# Patient Record
Sex: Female | Born: 1940
Health system: Southern US, Community
[De-identification: ages and names within clinical notes are randomized; demographics above are authoritative.]

## PROBLEM LIST (undated history)

## (undated) DIAGNOSIS — R7303 Prediabetes: Secondary | ICD-10-CM

## (undated) DIAGNOSIS — I422 Other hypertrophic cardiomyopathy: Secondary | ICD-10-CM

## (undated) DIAGNOSIS — I480 Paroxysmal atrial fibrillation: Secondary | ICD-10-CM

## (undated) DIAGNOSIS — D693 Immune thrombocytopenic purpura: Secondary | ICD-10-CM

## (undated) HISTORY — DX: Immune thrombocytopenic purpura: D69.3

## (undated) HISTORY — DX: Prediabetes: R73.03

## (undated) HISTORY — DX: Other hypertrophic cardiomyopathy: I42.2

## (undated) HISTORY — DX: Paroxysmal atrial fibrillation: I48.0

---

## 2016-03-12 DIAGNOSIS — Z23 Encounter for immunization: Secondary | ICD-10-CM | POA: Diagnosis not present

## 2016-06-12 DIAGNOSIS — S4992XA Unspecified injury of left shoulder and upper arm, initial encounter: Secondary | ICD-10-CM | POA: Diagnosis not present

## 2016-06-12 DIAGNOSIS — M25512 Pain in left shoulder: Secondary | ICD-10-CM | POA: Diagnosis not present

## 2016-06-12 DIAGNOSIS — R030 Elevated blood-pressure reading, without diagnosis of hypertension: Secondary | ICD-10-CM | POA: Diagnosis not present

## 2016-12-27 ENCOUNTER — Encounter (HOSPITAL_COMMUNITY): Payer: Self-pay | Admitting: Emergency Medicine

## 2016-12-27 ENCOUNTER — Ambulatory Visit (HOSPITAL_COMMUNITY)
Admission: EM | Admit: 2016-12-27 | Discharge: 2016-12-27 | Disposition: A | Discharge status: Home / Self Care | Payer: Medicare Other

## 2016-12-27 DIAGNOSIS — R062 Wheezing: Secondary | ICD-10-CM | POA: Diagnosis not present

## 2016-12-27 DIAGNOSIS — J069 Acute upper respiratory infection, unspecified: Secondary | ICD-10-CM | POA: Diagnosis not present

## 2016-12-27 MED ORDER — ALBUTEROL SULFATE HFA 108 (90 BASE) MCG/ACT IN AERS: 1.0000 | INHALATION_SPRAY | Freq: Four times a day (QID) | RESPIRATORY_TRACT | 0 refills | Status: DC | PRN

## 2016-12-27 MED ORDER — PREDNISONE 10 MG (21) PO TBPK: ORAL_TABLET | Freq: Every day | ORAL | 0 refills | Status: DC

## 2016-12-27 NOTE — ED Provider Notes (Signed)
CSN: [id number]     Arrival date & time 12/27/16  1530 History   None    Chief Complaint  Patient presents with  . Shortness of Breath   (Consider location/radiation/quality/duration/timing/severity/associated sxs/prior Treatment) 76 yo female comes in with daughter in law for worsening shortness of breath on exertion after being diagnosed with URI 5 days ago. She is on day 3-4 of Azithromax. Patient is visiting from [place] and got off the plane today. She states that she has had some coughing, but is feeling much since antibiotic use. However, she is experiencing wheezing and shortness on breath on exertion. Daughter in law states that they were shopping and patient had to rest due to shortness of breath after awhile, which is unusual for her. Patient without history of smoking, asthma, lung disease. Denies fever, ear/eye pain, abdominal pain. Denies sore throat, rhinorrhea. Denies sick contact.       History reviewed. No pertinent past medical history. History reviewed. No pertinent surgical history. History reviewed. No pertinent family history. Social History  Substance Use Topics  . Smoking status: Never Smoker  . Smokeless tobacco: Never Used  . Alcohol use No   OB History    No data available     Review of Systems  Constitutional: Negative for chills, diaphoresis and fever.  HENT: Negative for congestion, ear discharge, ear pain, postnasal drip, rhinorrhea, sinus pain, sinus pressure, sneezing and sore throat.   Eyes: Negative for pain, discharge, redness and itching.  Respiratory: Positive for cough, shortness of breath and wheezing.   Cardiovascular: Negative for chest pain and palpitations.  Gastrointestinal: Negative for abdominal pain.    Allergies  Patient has no known allergies.  Home Medications   Prior to Admission medications   Medication Sig Start Date End Date Taking? Authorizing Provider  azithromycin (ZITHROMAX) 250 MG tablet Take by mouth daily.   Yes  [provider]  albuterol (PROVENTIL HFA;VENTOLIN HFA) 108 (90 Base) MCG/ACT inhaler Inhale 1-2 puffs into the lungs every 6 (six) hours as needed for wheezing or shortness of breath. 12/27/16   Cathie Hoops, Amy V, PA-C  predniSONE (STERAPRED UNI-PAK 21 TAB) 10 MG (21) TBPK tablet Take by mouth daily. Take 6 tabs by mouth day 1, then 5 tabs, then 4 tabs, then 3 tabs, 2 tabs, then 1 tab for the last day 12/27/16   Belinda Fisher, PA-C   Meds Ordered and Administered this Visit  Medications - No data to display  BP 125/75 (BP Location: Right Arm)   Pulse 89   Temp 98.5 F (36.9 C) (Oral)   Resp 20   SpO2 96%  No data found.   Physical Exam  Constitutional: She is oriented to person, place, and time. She appears well-developed and well-nourished. No distress.  HENT:  Head: Normocephalic and atraumatic.  Right Ear: Tympanic membrane, external ear and ear canal normal.  Left Ear: Tympanic membrane, external ear and ear canal normal.  Nose: Nose normal.  Mouth/Throat: Uvula is midline, oropharynx is clear and moist and mucous membranes are normal.  Eyes: Conjunctivae are normal. Pupils are equal, round, and reactive to light.  Cardiovascular: Normal rate and regular rhythm.  Exam reveals no gallop and no friction rub.   Murmur heard. Pulmonary/Chest:  Patient with wheezing throughout. No rhonchi, rales noted. Not in respiratory distress.   Lymphadenopathy:    She has no cervical adenopathy.  Neurological: She is alert and oriented to person, place, and time.  Skin: Skin is warm and dry.  Psychiatric: She has a normal mood and affect. Her behavior is normal. Judgment normal.    Urgent Care Course     Procedures (including critical care time)  Labs Review Labs Reviewed - No data to display  Imaging Review No results found.      MDM   1. Viral URI   2. Wheezing    1. Discussed with patient and family member patient history and exam most consistent with reactive airway due to  URI. Patient to finish Azithromax. Start Prednisone dose pak as directed and Albuterol inhaler PRN. Instructions on inhaler provided for patient. Patient to monitor for further trouble breathing, fever, worsening of symptoms.    Belinda FisherYu, Amy V, PA-C 12/27/16 [id number]

## 2016-12-27 NOTE — ED Triage Notes (Signed)
[facility] with a complaint of SOB. The patient stated that she has been on a Z-Pack for a URI x 1 week. She reported increased SOB and a cough with exersion more recently.

## 2017-03-10 DIAGNOSIS — Z23 Encounter for immunization: Secondary | ICD-10-CM | POA: Diagnosis not present

## 2018-04-06 DIAGNOSIS — Z23 Encounter for immunization: Secondary | ICD-10-CM | POA: Diagnosis not present

## 2018-06-26 DIAGNOSIS — J208 Acute bronchitis due to other specified organisms: Secondary | ICD-10-CM | POA: Diagnosis not present

## 2018-06-26 DIAGNOSIS — J9801 Acute bronchospasm: Secondary | ICD-10-CM | POA: Diagnosis not present

## 2018-06-26 DIAGNOSIS — J101 Influenza due to other identified influenza virus with other respiratory manifestations: Secondary | ICD-10-CM | POA: Diagnosis not present

## 2018-06-26 DIAGNOSIS — E039 Hypothyroidism, unspecified: Secondary | ICD-10-CM | POA: Diagnosis not present

## 2018-06-26 DIAGNOSIS — M545 Low back pain: Secondary | ICD-10-CM | POA: Diagnosis not present

## 2018-06-26 DIAGNOSIS — G8929 Other chronic pain: Secondary | ICD-10-CM | POA: Diagnosis not present

## 2018-06-26 DIAGNOSIS — J9601 Acute respiratory failure with hypoxia: Secondary | ICD-10-CM | POA: Diagnosis not present

## 2018-06-26 DIAGNOSIS — R0602 Shortness of breath: Secondary | ICD-10-CM | POA: Diagnosis not present

## 2018-06-26 DIAGNOSIS — K2970 Gastritis, unspecified, without bleeding: Secondary | ICD-10-CM | POA: Diagnosis not present

## 2018-06-26 DIAGNOSIS — M25569 Pain in unspecified knee: Secondary | ICD-10-CM | POA: Diagnosis not present

## 2018-06-26 DIAGNOSIS — D509 Iron deficiency anemia, unspecified: Secondary | ICD-10-CM | POA: Diagnosis not present

## 2018-06-26 DIAGNOSIS — K648 Other hemorrhoids: Secondary | ICD-10-CM | POA: Diagnosis not present

## 2018-06-26 DIAGNOSIS — J111 Influenza due to unidentified influenza virus with other respiratory manifestations: Secondary | ICD-10-CM | POA: Diagnosis not present

## 2018-06-26 DIAGNOSIS — D62 Acute posthemorrhagic anemia: Secondary | ICD-10-CM | POA: Diagnosis not present

## 2018-06-26 DIAGNOSIS — Z789 Other specified health status: Secondary | ICD-10-CM | POA: Diagnosis not present

## 2018-06-26 DIAGNOSIS — I340 Nonrheumatic mitral (valve) insufficiency: Secondary | ICD-10-CM | POA: Diagnosis not present

## 2018-06-27 DIAGNOSIS — J9601 Acute respiratory failure with hypoxia: Secondary | ICD-10-CM | POA: Diagnosis not present

## 2018-06-27 DIAGNOSIS — D62 Acute posthemorrhagic anemia: Secondary | ICD-10-CM | POA: Diagnosis not present

## 2018-06-27 DIAGNOSIS — E039 Hypothyroidism, unspecified: Secondary | ICD-10-CM | POA: Diagnosis not present

## 2018-06-27 DIAGNOSIS — D509 Iron deficiency anemia, unspecified: Secondary | ICD-10-CM | POA: Diagnosis not present

## 2018-06-27 DIAGNOSIS — J111 Influenza due to unidentified influenza virus with other respiratory manifestations: Secondary | ICD-10-CM | POA: Diagnosis not present

## 2018-06-27 DIAGNOSIS — J101 Influenza due to other identified influenza virus with other respiratory manifestations: Secondary | ICD-10-CM | POA: Diagnosis not present

## 2018-06-28 DIAGNOSIS — R0602 Shortness of breath: Secondary | ICD-10-CM | POA: Diagnosis not present

## 2018-06-28 DIAGNOSIS — Z79899 Other long term (current) drug therapy: Secondary | ICD-10-CM | POA: Diagnosis not present

## 2018-06-28 DIAGNOSIS — Z87891 Personal history of nicotine dependence: Secondary | ICD-10-CM | POA: Diagnosis not present

## 2018-06-28 DIAGNOSIS — D509 Iron deficiency anemia, unspecified: Secondary | ICD-10-CM | POA: Diagnosis not present

## 2018-06-28 DIAGNOSIS — Z789 Other specified health status: Secondary | ICD-10-CM | POA: Diagnosis not present

## 2018-06-28 DIAGNOSIS — K649 Unspecified hemorrhoids: Secondary | ICD-10-CM | POA: Diagnosis not present

## 2018-06-28 DIAGNOSIS — K3189 Other diseases of stomach and duodenum: Secondary | ICD-10-CM | POA: Diagnosis not present

## 2018-06-28 DIAGNOSIS — D62 Acute posthemorrhagic anemia: Secondary | ICD-10-CM | POA: Diagnosis not present

## 2018-06-28 DIAGNOSIS — D558 Other anemias due to enzyme disorders: Secondary | ICD-10-CM | POA: Diagnosis not present

## 2018-06-28 DIAGNOSIS — J9601 Acute respiratory failure with hypoxia: Secondary | ICD-10-CM | POA: Diagnosis not present

## 2018-06-28 DIAGNOSIS — M25569 Pain in unspecified knee: Secondary | ICD-10-CM | POA: Diagnosis not present

## 2018-06-28 DIAGNOSIS — G8929 Other chronic pain: Secondary | ICD-10-CM | POA: Diagnosis not present

## 2018-06-28 DIAGNOSIS — M545 Low back pain: Secondary | ICD-10-CM | POA: Diagnosis not present

## 2018-06-28 DIAGNOSIS — K648 Other hemorrhoids: Secondary | ICD-10-CM | POA: Diagnosis not present

## 2018-06-28 DIAGNOSIS — I340 Nonrheumatic mitral (valve) insufficiency: Secondary | ICD-10-CM | POA: Diagnosis not present

## 2018-06-28 DIAGNOSIS — E039 Hypothyroidism, unspecified: Secondary | ICD-10-CM | POA: Diagnosis not present

## 2018-06-28 DIAGNOSIS — J101 Influenza due to other identified influenza virus with other respiratory manifestations: Secondary | ICD-10-CM | POA: Diagnosis not present

## 2018-06-28 DIAGNOSIS — J208 Acute bronchitis due to other specified organisms: Secondary | ICD-10-CM | POA: Diagnosis not present

## 2018-06-28 DIAGNOSIS — K2970 Gastritis, unspecified, without bleeding: Secondary | ICD-10-CM | POA: Diagnosis not present

## 2018-06-29 DIAGNOSIS — K2970 Gastritis, unspecified, without bleeding: Secondary | ICD-10-CM | POA: Diagnosis not present

## 2018-06-29 DIAGNOSIS — J101 Influenza due to other identified influenza virus with other respiratory manifestations: Secondary | ICD-10-CM | POA: Diagnosis not present

## 2018-06-29 DIAGNOSIS — M25569 Pain in unspecified knee: Secondary | ICD-10-CM | POA: Diagnosis not present

## 2018-06-29 DIAGNOSIS — J208 Acute bronchitis due to other specified organisms: Secondary | ICD-10-CM | POA: Diagnosis not present

## 2018-06-29 DIAGNOSIS — J9601 Acute respiratory failure with hypoxia: Secondary | ICD-10-CM | POA: Diagnosis not present

## 2018-06-29 DIAGNOSIS — M545 Low back pain: Secondary | ICD-10-CM | POA: Diagnosis not present

## 2018-06-29 DIAGNOSIS — Z789 Other specified health status: Secondary | ICD-10-CM | POA: Diagnosis not present

## 2018-06-29 DIAGNOSIS — I340 Nonrheumatic mitral (valve) insufficiency: Secondary | ICD-10-CM | POA: Diagnosis not present

## 2018-06-29 DIAGNOSIS — K648 Other hemorrhoids: Secondary | ICD-10-CM | POA: Diagnosis not present

## 2018-06-29 DIAGNOSIS — D62 Acute posthemorrhagic anemia: Secondary | ICD-10-CM | POA: Diagnosis not present

## 2018-06-29 DIAGNOSIS — E039 Hypothyroidism, unspecified: Secondary | ICD-10-CM | POA: Diagnosis not present

## 2018-06-29 DIAGNOSIS — G8929 Other chronic pain: Secondary | ICD-10-CM | POA: Diagnosis not present

## 2018-06-30 DIAGNOSIS — D509 Iron deficiency anemia, unspecified: Secondary | ICD-10-CM | POA: Diagnosis not present

## 2018-07-01 DIAGNOSIS — I340 Nonrheumatic mitral (valve) insufficiency: Secondary | ICD-10-CM | POA: Diagnosis not present

## 2018-07-01 DIAGNOSIS — R0602 Shortness of breath: Secondary | ICD-10-CM | POA: Diagnosis not present

## 2018-07-01 DIAGNOSIS — Z79899 Other long term (current) drug therapy: Secondary | ICD-10-CM | POA: Diagnosis not present

## 2018-07-01 DIAGNOSIS — I248 Other forms of acute ischemic heart disease: Secondary | ICD-10-CM | POA: Diagnosis not present

## 2018-07-01 DIAGNOSIS — J9601 Acute respiratory failure with hypoxia: Secondary | ICD-10-CM | POA: Diagnosis not present

## 2018-07-01 DIAGNOSIS — J101 Influenza due to other identified influenza virus with other respiratory manifestations: Secondary | ICD-10-CM | POA: Diagnosis not present

## 2018-07-01 DIAGNOSIS — E039 Hypothyroidism, unspecified: Secondary | ICD-10-CM | POA: Diagnosis not present

## 2018-07-01 DIAGNOSIS — R0601 Orthopnea: Secondary | ICD-10-CM | POA: Diagnosis not present

## 2018-07-01 DIAGNOSIS — I493 Ventricular premature depolarization: Secondary | ICD-10-CM | POA: Diagnosis not present

## 2018-07-01 DIAGNOSIS — D509 Iron deficiency anemia, unspecified: Secondary | ICD-10-CM | POA: Diagnosis not present

## 2018-07-01 DIAGNOSIS — R7989 Other specified abnormal findings of blood chemistry: Secondary | ICD-10-CM | POA: Diagnosis not present

## 2018-07-01 DIAGNOSIS — Z79890 Hormone replacement therapy (postmenopausal): Secondary | ICD-10-CM | POA: Diagnosis not present

## 2018-07-01 DIAGNOSIS — K2970 Gastritis, unspecified, without bleeding: Secondary | ICD-10-CM | POA: Diagnosis not present

## 2018-07-01 DIAGNOSIS — J13 Pneumonia due to Streptococcus pneumoniae: Secondary | ICD-10-CM | POA: Diagnosis not present

## 2018-07-01 DIAGNOSIS — J9801 Acute bronchospasm: Secondary | ICD-10-CM | POA: Diagnosis not present

## 2018-07-01 DIAGNOSIS — Q248 Other specified congenital malformations of heart: Secondary | ICD-10-CM | POA: Diagnosis not present

## 2018-07-01 DIAGNOSIS — R0600 Dyspnea, unspecified: Secondary | ICD-10-CM | POA: Diagnosis not present

## 2018-07-02 DIAGNOSIS — Z79890 Hormone replacement therapy (postmenopausal): Secondary | ICD-10-CM | POA: Diagnosis not present

## 2018-07-02 DIAGNOSIS — R0601 Orthopnea: Secondary | ICD-10-CM | POA: Diagnosis not present

## 2018-07-02 DIAGNOSIS — R0602 Shortness of breath: Secondary | ICD-10-CM | POA: Diagnosis not present

## 2018-07-02 DIAGNOSIS — Q248 Other specified congenital malformations of heart: Secondary | ICD-10-CM | POA: Diagnosis not present

## 2018-07-02 DIAGNOSIS — J9801 Acute bronchospasm: Secondary | ICD-10-CM | POA: Diagnosis not present

## 2018-07-02 DIAGNOSIS — I517 Cardiomegaly: Secondary | ICD-10-CM | POA: Diagnosis not present

## 2018-07-02 DIAGNOSIS — R7889 Finding of other specified substances, not normally found in blood: Secondary | ICD-10-CM | POA: Diagnosis not present

## 2018-07-02 DIAGNOSIS — J101 Influenza due to other identified influenza virus with other respiratory manifestations: Secondary | ICD-10-CM | POA: Diagnosis not present

## 2018-07-02 DIAGNOSIS — Z79899 Other long term (current) drug therapy: Secondary | ICD-10-CM | POA: Diagnosis not present

## 2018-07-02 DIAGNOSIS — I493 Ventricular premature depolarization: Secondary | ICD-10-CM | POA: Diagnosis not present

## 2018-07-02 DIAGNOSIS — Z87891 Personal history of nicotine dependence: Secondary | ICD-10-CM | POA: Diagnosis not present

## 2018-07-02 DIAGNOSIS — J9601 Acute respiratory failure with hypoxia: Secondary | ICD-10-CM | POA: Diagnosis not present

## 2018-07-02 DIAGNOSIS — J189 Pneumonia, unspecified organism: Secondary | ICD-10-CM | POA: Diagnosis not present

## 2018-07-02 DIAGNOSIS — K2970 Gastritis, unspecified, without bleeding: Secondary | ICD-10-CM | POA: Diagnosis not present

## 2018-07-02 DIAGNOSIS — J181 Lobar pneumonia, unspecified organism: Secondary | ICD-10-CM | POA: Diagnosis not present

## 2018-07-02 DIAGNOSIS — E039 Hypothyroidism, unspecified: Secondary | ICD-10-CM | POA: Diagnosis not present

## 2018-07-02 DIAGNOSIS — J40 Bronchitis, not specified as acute or chronic: Secondary | ICD-10-CM | POA: Diagnosis not present

## 2018-07-02 DIAGNOSIS — D509 Iron deficiency anemia, unspecified: Secondary | ICD-10-CM | POA: Diagnosis not present

## 2018-07-02 DIAGNOSIS — I340 Nonrheumatic mitral (valve) insufficiency: Secondary | ICD-10-CM | POA: Diagnosis not present

## 2018-07-02 DIAGNOSIS — Z720 Tobacco use: Secondary | ICD-10-CM | POA: Diagnosis not present

## 2018-07-02 DIAGNOSIS — J13 Pneumonia due to Streptococcus pneumoniae: Secondary | ICD-10-CM | POA: Diagnosis not present

## 2018-07-02 DIAGNOSIS — I248 Other forms of acute ischemic heart disease: Secondary | ICD-10-CM | POA: Diagnosis not present

## 2018-07-02 DIAGNOSIS — R918 Other nonspecific abnormal finding of lung field: Secondary | ICD-10-CM | POA: Diagnosis not present

## 2018-07-14 DIAGNOSIS — J13 Pneumonia due to Streptococcus pneumoniae: Secondary | ICD-10-CM | POA: Diagnosis not present

## 2018-07-14 DIAGNOSIS — J209 Acute bronchitis, unspecified: Secondary | ICD-10-CM | POA: Diagnosis not present

## 2018-07-14 DIAGNOSIS — E039 Hypothyroidism, unspecified: Secondary | ICD-10-CM | POA: Diagnosis not present

## 2018-07-14 DIAGNOSIS — I517 Cardiomegaly: Secondary | ICD-10-CM | POA: Diagnosis not present

## 2018-07-14 DIAGNOSIS — K2900 Acute gastritis without bleeding: Secondary | ICD-10-CM | POA: Diagnosis not present

## 2018-07-14 DIAGNOSIS — D509 Iron deficiency anemia, unspecified: Secondary | ICD-10-CM | POA: Diagnosis not present

## 2018-08-05 DIAGNOSIS — I776 Arteritis, unspecified: Secondary | ICD-10-CM | POA: Diagnosis not present

## 2018-08-05 DIAGNOSIS — L959 Vasculitis limited to the skin, unspecified: Secondary | ICD-10-CM | POA: Diagnosis not present

## 2018-08-05 DIAGNOSIS — D696 Thrombocytopenia, unspecified: Secondary | ICD-10-CM | POA: Diagnosis not present

## 2018-08-05 DIAGNOSIS — E039 Hypothyroidism, unspecified: Secondary | ICD-10-CM | POA: Diagnosis not present

## 2018-08-11 DIAGNOSIS — Z87891 Personal history of nicotine dependence: Secondary | ICD-10-CM | POA: Diagnosis not present

## 2018-08-11 DIAGNOSIS — D649 Anemia, unspecified: Secondary | ICD-10-CM | POA: Diagnosis not present

## 2018-08-11 DIAGNOSIS — R0602 Shortness of breath: Secondary | ICD-10-CM | POA: Diagnosis not present

## 2018-08-11 DIAGNOSIS — Z8701 Personal history of pneumonia (recurrent): Secondary | ICD-10-CM | POA: Diagnosis not present

## 2018-08-11 DIAGNOSIS — D696 Thrombocytopenia, unspecified: Secondary | ICD-10-CM | POA: Diagnosis not present

## 2018-08-11 DIAGNOSIS — E039 Hypothyroidism, unspecified: Secondary | ICD-10-CM | POA: Diagnosis not present

## 2018-08-11 DIAGNOSIS — I517 Cardiomegaly: Secondary | ICD-10-CM | POA: Diagnosis not present

## 2018-08-11 DIAGNOSIS — R918 Other nonspecific abnormal finding of lung field: Secondary | ICD-10-CM | POA: Diagnosis not present

## 2018-08-11 DIAGNOSIS — R21 Rash and other nonspecific skin eruption: Secondary | ICD-10-CM | POA: Diagnosis not present

## 2018-08-11 DIAGNOSIS — R590 Localized enlarged lymph nodes: Secondary | ICD-10-CM | POA: Diagnosis not present

## 2018-08-11 DIAGNOSIS — K2900 Acute gastritis without bleeding: Secondary | ICD-10-CM | POA: Diagnosis not present

## 2018-08-11 DIAGNOSIS — D509 Iron deficiency anemia, unspecified: Secondary | ICD-10-CM | POA: Diagnosis not present

## 2018-08-14 DIAGNOSIS — E039 Hypothyroidism, unspecified: Secondary | ICD-10-CM | POA: Diagnosis not present

## 2018-08-14 DIAGNOSIS — D509 Iron deficiency anemia, unspecified: Secondary | ICD-10-CM | POA: Diagnosis not present

## 2018-08-14 DIAGNOSIS — R21 Rash and other nonspecific skin eruption: Secondary | ICD-10-CM | POA: Diagnosis not present

## 2018-08-14 DIAGNOSIS — D696 Thrombocytopenia, unspecified: Secondary | ICD-10-CM | POA: Diagnosis not present

## 2018-08-14 DIAGNOSIS — I517 Cardiomegaly: Secondary | ICD-10-CM | POA: Diagnosis not present

## 2018-08-14 DIAGNOSIS — H04129 Dry eye syndrome of unspecified lacrimal gland: Secondary | ICD-10-CM | POA: Diagnosis not present

## 2018-08-14 DIAGNOSIS — K2900 Acute gastritis without bleeding: Secondary | ICD-10-CM | POA: Diagnosis not present

## 2018-08-14 DIAGNOSIS — J3489 Other specified disorders of nose and nasal sinuses: Secondary | ICD-10-CM | POA: Diagnosis not present

## 2018-08-18 DIAGNOSIS — D509 Iron deficiency anemia, unspecified: Secondary | ICD-10-CM | POA: Diagnosis not present

## 2018-08-18 DIAGNOSIS — R21 Rash and other nonspecific skin eruption: Secondary | ICD-10-CM | POA: Diagnosis not present

## 2018-08-18 DIAGNOSIS — E039 Hypothyroidism, unspecified: Secondary | ICD-10-CM | POA: Diagnosis not present

## 2018-08-18 DIAGNOSIS — D696 Thrombocytopenia, unspecified: Secondary | ICD-10-CM | POA: Diagnosis not present

## 2018-08-18 DIAGNOSIS — M25473 Effusion, unspecified ankle: Secondary | ICD-10-CM | POA: Diagnosis not present

## 2018-08-18 DIAGNOSIS — I5032 Chronic diastolic (congestive) heart failure: Secondary | ICD-10-CM | POA: Diagnosis not present

## 2018-08-18 DIAGNOSIS — Q203 Discordant ventriculoarterial connection: Secondary | ICD-10-CM | POA: Diagnosis not present

## 2018-08-18 DIAGNOSIS — K2900 Acute gastritis without bleeding: Secondary | ICD-10-CM | POA: Diagnosis not present

## 2018-08-18 DIAGNOSIS — I517 Cardiomegaly: Secondary | ICD-10-CM | POA: Diagnosis not present

## 2018-08-24 DIAGNOSIS — R590 Localized enlarged lymph nodes: Secondary | ICD-10-CM | POA: Diagnosis not present

## 2018-08-24 DIAGNOSIS — J181 Lobar pneumonia, unspecified organism: Secondary | ICD-10-CM | POA: Diagnosis not present

## 2018-08-24 DIAGNOSIS — R918 Other nonspecific abnormal finding of lung field: Secondary | ICD-10-CM | POA: Diagnosis not present

## 2018-09-01 DIAGNOSIS — I517 Cardiomegaly: Secondary | ICD-10-CM | POA: Diagnosis not present

## 2018-09-01 DIAGNOSIS — I5032 Chronic diastolic (congestive) heart failure: Secondary | ICD-10-CM | POA: Diagnosis not present

## 2018-09-01 DIAGNOSIS — Q203 Discordant ventriculoarterial connection: Secondary | ICD-10-CM | POA: Diagnosis not present

## 2018-09-06 DIAGNOSIS — R918 Other nonspecific abnormal finding of lung field: Secondary | ICD-10-CM | POA: Diagnosis not present

## 2018-09-06 DIAGNOSIS — J449 Chronic obstructive pulmonary disease, unspecified: Secondary | ICD-10-CM | POA: Diagnosis not present

## 2018-09-06 DIAGNOSIS — F17211 Nicotine dependence, cigarettes, in remission: Secondary | ICD-10-CM | POA: Diagnosis not present

## 2018-09-06 DIAGNOSIS — R0602 Shortness of breath: Secondary | ICD-10-CM | POA: Diagnosis not present

## 2018-09-21 DIAGNOSIS — I517 Cardiomegaly: Secondary | ICD-10-CM | POA: Diagnosis not present

## 2018-09-21 DIAGNOSIS — I5032 Chronic diastolic (congestive) heart failure: Secondary | ICD-10-CM | POA: Diagnosis not present

## 2018-09-21 DIAGNOSIS — Q203 Discordant ventriculoarterial connection: Secondary | ICD-10-CM | POA: Diagnosis not present

## 2018-11-24 DIAGNOSIS — I517 Cardiomegaly: Secondary | ICD-10-CM | POA: Diagnosis not present

## 2018-11-24 DIAGNOSIS — D509 Iron deficiency anemia, unspecified: Secondary | ICD-10-CM | POA: Diagnosis not present

## 2018-11-24 DIAGNOSIS — J449 Chronic obstructive pulmonary disease, unspecified: Secondary | ICD-10-CM | POA: Diagnosis not present

## 2018-12-06 DIAGNOSIS — Q203 Discordant ventriculoarterial connection: Secondary | ICD-10-CM | POA: Diagnosis not present

## 2018-12-06 DIAGNOSIS — I517 Cardiomegaly: Secondary | ICD-10-CM | POA: Diagnosis not present

## 2018-12-06 DIAGNOSIS — I5032 Chronic diastolic (congestive) heart failure: Secondary | ICD-10-CM | POA: Diagnosis not present

## 2019-01-05 DIAGNOSIS — E559 Vitamin D deficiency, unspecified: Secondary | ICD-10-CM | POA: Diagnosis not present

## 2019-01-05 DIAGNOSIS — E039 Hypothyroidism, unspecified: Secondary | ICD-10-CM | POA: Diagnosis not present

## 2019-01-05 DIAGNOSIS — I422 Other hypertrophic cardiomyopathy: Secondary | ICD-10-CM | POA: Diagnosis not present

## 2019-01-05 DIAGNOSIS — I517 Cardiomegaly: Secondary | ICD-10-CM | POA: Diagnosis not present

## 2019-01-05 DIAGNOSIS — D509 Iron deficiency anemia, unspecified: Secondary | ICD-10-CM | POA: Diagnosis not present

## 2019-01-25 DIAGNOSIS — D696 Thrombocytopenia, unspecified: Secondary | ICD-10-CM | POA: Diagnosis not present

## 2019-01-25 DIAGNOSIS — I5032 Chronic diastolic (congestive) heart failure: Secondary | ICD-10-CM | POA: Diagnosis not present

## 2019-01-25 DIAGNOSIS — I422 Other hypertrophic cardiomyopathy: Secondary | ICD-10-CM | POA: Diagnosis not present

## 2019-01-25 DIAGNOSIS — J449 Chronic obstructive pulmonary disease, unspecified: Secondary | ICD-10-CM | POA: Diagnosis not present

## 2019-01-25 DIAGNOSIS — E0590 Thyrotoxicosis, unspecified without thyrotoxic crisis or storm: Secondary | ICD-10-CM | POA: Diagnosis not present

## 2019-01-29 DIAGNOSIS — R590 Localized enlarged lymph nodes: Secondary | ICD-10-CM | POA: Diagnosis not present

## 2019-01-29 DIAGNOSIS — D500 Iron deficiency anemia secondary to blood loss (chronic): Secondary | ICD-10-CM | POA: Diagnosis not present

## 2019-01-29 DIAGNOSIS — D696 Thrombocytopenia, unspecified: Secondary | ICD-10-CM | POA: Diagnosis not present

## 2019-01-29 DIAGNOSIS — D693 Immune thrombocytopenic purpura: Secondary | ICD-10-CM | POA: Diagnosis not present

## 2019-01-29 DIAGNOSIS — I422 Other hypertrophic cardiomyopathy: Secondary | ICD-10-CM | POA: Diagnosis not present

## 2019-02-12 DIAGNOSIS — R0602 Shortness of breath: Secondary | ICD-10-CM | POA: Diagnosis not present

## 2019-02-12 DIAGNOSIS — F17211 Nicotine dependence, cigarettes, in remission: Secondary | ICD-10-CM | POA: Diagnosis not present

## 2019-02-12 DIAGNOSIS — I5032 Chronic diastolic (congestive) heart failure: Secondary | ICD-10-CM | POA: Diagnosis not present

## 2019-02-12 DIAGNOSIS — J449 Chronic obstructive pulmonary disease, unspecified: Secondary | ICD-10-CM | POA: Diagnosis not present

## 2019-03-02 DIAGNOSIS — D500 Iron deficiency anemia secondary to blood loss (chronic): Secondary | ICD-10-CM | POA: Diagnosis not present

## 2019-03-02 DIAGNOSIS — D693 Immune thrombocytopenic purpura: Secondary | ICD-10-CM | POA: Diagnosis not present

## 2019-03-13 DIAGNOSIS — Z23 Encounter for immunization: Secondary | ICD-10-CM | POA: Diagnosis not present

## 2019-03-13 DIAGNOSIS — D696 Thrombocytopenia, unspecified: Secondary | ICD-10-CM | POA: Diagnosis not present

## 2019-03-13 DIAGNOSIS — I422 Other hypertrophic cardiomyopathy: Secondary | ICD-10-CM | POA: Diagnosis not present

## 2019-03-13 DIAGNOSIS — M25472 Effusion, left ankle: Secondary | ICD-10-CM | POA: Diagnosis not present

## 2019-03-13 DIAGNOSIS — E039 Hypothyroidism, unspecified: Secondary | ICD-10-CM | POA: Diagnosis not present

## 2019-03-14 DIAGNOSIS — M25472 Effusion, left ankle: Secondary | ICD-10-CM | POA: Diagnosis not present

## 2019-03-14 DIAGNOSIS — M19072 Primary osteoarthritis, left ankle and foot: Secondary | ICD-10-CM | POA: Diagnosis not present

## 2019-03-30 DIAGNOSIS — D693 Immune thrombocytopenic purpura: Secondary | ICD-10-CM | POA: Diagnosis not present

## 2019-03-30 DIAGNOSIS — D500 Iron deficiency anemia secondary to blood loss (chronic): Secondary | ICD-10-CM | POA: Diagnosis not present

## 2019-03-30 DIAGNOSIS — I422 Other hypertrophic cardiomyopathy: Secondary | ICD-10-CM | POA: Diagnosis not present

## 2019-03-30 DIAGNOSIS — D509 Iron deficiency anemia, unspecified: Secondary | ICD-10-CM | POA: Diagnosis not present

## 2019-03-30 DIAGNOSIS — D696 Thrombocytopenia, unspecified: Secondary | ICD-10-CM | POA: Diagnosis not present

## 2019-04-24 ENCOUNTER — Other Ambulatory Visit (HOSPITAL_COMMUNITY)
Admission: RE | Admit: 2019-04-24 | Discharge: 2019-04-24 | Disposition: A | Discharge status: Home / Self Care | Payer: Medicare Other | Source: Ambulatory Visit | Attending: Ophthalmology | Admitting: Ophthalmology

## 2019-04-24 DIAGNOSIS — Z20828 Contact with and (suspected) exposure to other viral communicable diseases: Secondary | ICD-10-CM | POA: Diagnosis not present

## 2019-04-24 DIAGNOSIS — Z01812 Encounter for preprocedural laboratory examination: Secondary | ICD-10-CM | POA: Diagnosis not present

## 2019-04-25 LAB — NOVEL CORONAVIRUS, NAA (HOSP ORDER, SEND-OUT TO REF LAB; TAT 18-24 HRS): SARS-CoV-2, NAA: NOT DETECTED

## 2019-04-27 ENCOUNTER — Encounter (HOSPITAL_COMMUNITY): Admission: RE | Payer: Self-pay | Source: Home / Self Care

## 2019-04-27 ENCOUNTER — Ambulatory Visit (HOSPITAL_COMMUNITY): Admission: RE | Admit: 2019-04-27 | Payer: Medicare Other | Source: Home / Self Care | Admitting: Ophthalmology

## 2019-04-27 SURGERY — PROBING, LACRIMAL DUCT, WITH BALLOON DILATION
Anesthesia: Monitor Anesthesia Care | Laterality: Right

## 2019-09-26 ENCOUNTER — Emergency Department (HOSPITAL_COMMUNITY): Payer: Medicare Other

## 2019-09-26 ENCOUNTER — Inpatient Hospital Stay (HOSPITAL_COMMUNITY)
Admission: EM | Admit: 2019-09-26 | Discharge: 2019-09-28 | DRG: 309 | Disposition: A | Discharge status: Home Health | Payer: Medicare Other | Attending: Internal Medicine | Admitting: Internal Medicine

## 2019-09-26 ENCOUNTER — Encounter (HOSPITAL_COMMUNITY): Payer: Self-pay

## 2019-09-26 DIAGNOSIS — Z8249 Family history of ischemic heart disease and other diseases of the circulatory system: Secondary | ICD-10-CM

## 2019-09-26 DIAGNOSIS — D693 Immune thrombocytopenic purpura: Secondary | ICD-10-CM | POA: Diagnosis present

## 2019-09-26 DIAGNOSIS — M25512 Pain in left shoulder: Secondary | ICD-10-CM | POA: Diagnosis present

## 2019-09-26 DIAGNOSIS — J206 Acute bronchitis due to rhinovirus: Secondary | ICD-10-CM | POA: Diagnosis present

## 2019-09-26 DIAGNOSIS — D696 Thrombocytopenia, unspecified: Secondary | ICD-10-CM

## 2019-09-26 DIAGNOSIS — J439 Emphysema, unspecified: Secondary | ICD-10-CM | POA: Diagnosis not present

## 2019-09-26 DIAGNOSIS — I421 Obstructive hypertrophic cardiomyopathy: Secondary | ICD-10-CM | POA: Diagnosis present

## 2019-09-26 DIAGNOSIS — I248 Other forms of acute ischemic heart disease: Secondary | ICD-10-CM | POA: Diagnosis present

## 2019-09-26 DIAGNOSIS — I4891 Unspecified atrial fibrillation: Secondary | ICD-10-CM | POA: Diagnosis not present

## 2019-09-26 DIAGNOSIS — Z20822 Contact with and (suspected) exposure to covid-19: Secondary | ICD-10-CM | POA: Diagnosis present

## 2019-09-26 DIAGNOSIS — E039 Hypothyroidism, unspecified: Secondary | ICD-10-CM

## 2019-09-26 DIAGNOSIS — I422 Other hypertrophic cardiomyopathy: Secondary | ICD-10-CM | POA: Diagnosis not present

## 2019-09-26 DIAGNOSIS — M25511 Pain in right shoulder: Secondary | ICD-10-CM | POA: Diagnosis present

## 2019-09-26 DIAGNOSIS — J449 Chronic obstructive pulmonary disease, unspecified: Secondary | ICD-10-CM | POA: Diagnosis present

## 2019-09-26 DIAGNOSIS — E876 Hypokalemia: Secondary | ICD-10-CM | POA: Diagnosis not present

## 2019-09-26 DIAGNOSIS — J811 Chronic pulmonary edema: Secondary | ICD-10-CM

## 2019-09-26 DIAGNOSIS — E871 Hypo-osmolality and hyponatremia: Secondary | ICD-10-CM | POA: Diagnosis present

## 2019-09-26 LAB — BASIC METABOLIC PANEL
Anion gap: 15 (ref 5–15)
BUN: 9 mg/dL (ref 8–23)
CO2: 19 mmol/L — ABNORMAL LOW (ref 22–32)
Calcium: 9.4 mg/dL (ref 8.9–10.3)
Chloride: 100 mmol/L (ref 98–111)
Creatinine, Ser: 0.6 mg/dL (ref 0.44–1.00)
GFR calc Af Amer: >60 mL/min (ref >60)
GFR calc non Af Amer: >60 mL/min (ref >60)
Glucose, Bld: 148 mg/dL — ABNORMAL HIGH (ref 70–99)
Potassium: 3.3 mmol/L — ABNORMAL LOW (ref 3.5–5.1)
Sodium: 134 mmol/L — ABNORMAL LOW (ref 135–145)

## 2019-09-26 LAB — CBC
HCT: 40.8 % (ref 36.0–46.0)
Hemoglobin: 13.1 g/dL (ref 12.0–15.0)
MCH: 28.4 pg (ref 26.0–34.0)
MCHC: 32.1 g/dL (ref 30.0–36.0)
MCV: 88.5 fL (ref 80.0–100.0)
Platelets: 30 10*3/uL — ABNORMAL LOW (ref 150–400)
RBC: 4.61 MIL/uL (ref 3.87–5.11)
RDW: 12.5 % (ref 11.5–15.5)
WBC: 9.8 10*3/uL (ref 4.0–10.5)
nRBC: 0 % (ref 0.0–0.2)

## 2019-09-26 LAB — MAGNESIUM: Magnesium: 1.8 mg/dL (ref 1.7–2.4)

## 2019-09-26 LAB — TROPONIN I (HIGH SENSITIVITY): Troponin I (High Sensitivity): 15 ng/L (ref <18)

## 2019-09-26 MED ORDER — ETOMIDATE 2 MG/ML IV SOLN
6.0000 mg | Freq: Once | INTRAVENOUS | Status: AC
  Administered 2019-09-26: 6 mg via INTRAVENOUS
  Filled 2019-09-26: qty 10

## 2019-09-26 MED ORDER — DILTIAZEM HCL-DEXTROSE 125-5 MG/125ML-% IV SOLN (PREMIX)
5.0000 mg/h | INTRAVENOUS | Status: DC
  Administered 2019-09-26: 5 mg/h via INTRAVENOUS
  Filled 2019-09-26: qty 125

## 2019-09-26 MED ORDER — DILTIAZEM LOAD VIA INFUSION
10.0000 mg | Freq: Once | INTRAVENOUS | Status: AC
  Administered 2019-09-26: 10 mg via INTRAVENOUS
  Filled 2019-09-26: qty 10

## 2019-09-26 MED ORDER — MAGNESIUM SULFATE 2 GM/50ML IV SOLN
2.0000 g | Freq: Once | INTRAVENOUS | Status: AC
  Administered 2019-09-27: 2 g via INTRAVENOUS
  Filled 2019-09-26: qty 50

## 2019-09-26 MED ORDER — FENTANYL CITRATE (PF) 100 MCG/2ML IJ SOLN
25.0000 ug | Freq: Once | INTRAMUSCULAR | Status: AC
Start: 2019-09-26 — End: 2019-09-26
  Administered 2019-09-26: 25 ug via INTRAVENOUS
  Filled 2019-09-26: qty 2

## 2019-09-26 NOTE — ED Notes (Signed)
repaged cardio to Dr.Dykstra

## 2019-09-26 NOTE — ED Provider Notes (Signed)
[facility] EMERGENCY DEPARTMENT Provider Note   CSN: [id number] Arrival date & time: 09/26/19  2225     History Chief Complaint  Patient presents with  . Tachycardia    Alyssa Nguyen is a 79 y.o. female.  Presents to ER with concern for elevated heart rate.  Patient reports times of generalized weakness over the past couple days but this evening felt sudden onset of chest/bilateral shoulder pain.  Also having some associated shortness of breath.  Son reports that he had been periodically checking her oxygen as well as heart rate at home.  Over the last couple days has had normal heart rate and normal oxygen levels.  This evening noted heart rate up to 150s and then would spontaneously come back down to 80s.  Past medical history hypertrophic cardiomyopathy, ITP   No prior history of A. fib, stroke.  HPI     History reviewed. No pertinent past medical history.  There are no problems to display for this patient.   History reviewed. No pertinent surgical history.   OB History   No obstetric history on file.     History reviewed. No pertinent family history.  Social History   Tobacco Use  . Smoking status: Never Smoker  . Smokeless tobacco: Never Used  Substance Use Topics  . Alcohol use: No  . Drug use: No    Home Medications Prior to Admission medications   Medication Sig Start Date End Date Taking? Authorizing Provider  albuterol (PROVENTIL HFA;VENTOLIN HFA) 108 (90 Base) MCG/ACT inhaler Inhale 1-2 puffs into the lungs every 6 (six) hours as needed for wheezing or shortness of breath. 12/27/16   Cathie Hoops, Amy V, PA-C  azithromycin (ZITHROMAX) 250 MG tablet Take by mouth daily.    [provider]  predniSONE (STERAPRED UNI-PAK 21 TAB) 10 MG (21) TBPK tablet Take by mouth daily. Take 6 tabs by mouth day 1, then 5 tabs, then 4 tabs, then 3 tabs, 2 tabs, then 1 tab for the last day 12/27/16   Belinda Fisher, PA-C    Allergies    Patient has no known  allergies.  Review of Systems   Review of Systems  Constitutional: Negative for chills and fever.  HENT: Negative for ear pain and sore throat.   Eyes: Negative for pain and visual disturbance.  Respiratory: Positive for shortness of breath. Negative for cough.   Cardiovascular: Positive for chest pain and palpitations.  Gastrointestinal: Negative for abdominal pain and vomiting.  Genitourinary: Negative for dysuria and hematuria.  Musculoskeletal: Negative for arthralgias and back pain.  Skin: Negative for color change and rash.  Neurological: Negative for seizures and syncope.  All other systems reviewed and are negative.   Physical Exam Updated Vital Signs BP 118/90   Pulse (!) 152   Temp 98.6 F (37 C)   Resp (!) 29   Ht 4\' 11"  (1.499 m)   Wt 43.1 kg   SpO2 98%   BMI 19.19 kg/m   Physical Exam Vitals and nursing note reviewed.  Constitutional:      General: She is not in acute distress.    Appearance: She is well-developed.  HENT:     Head: Normocephalic and atraumatic.  Eyes:     Conjunctiva/sclera: Conjunctivae normal.  Cardiovascular:     Rate and Rhythm: Tachycardia present. Rhythm irregular.     Heart sounds: No murmur.  Pulmonary:     Effort: Pulmonary effort is normal. No respiratory distress.  Breath sounds: Normal breath sounds.  Abdominal:     Palpations: Abdomen is soft.     Tenderness: There is no abdominal tenderness.  Musculoskeletal:        General: No deformity or signs of injury.     Cervical back: Neck supple.  Skin:    General: Skin is warm and dry.     Capillary Refill: Capillary refill takes less than 2 seconds.  Neurological:     General: No focal deficit present.     Mental Status: She is alert and oriented to person, place, and time.  Psychiatric:        Mood and Affect: Mood normal.        Behavior: Behavior normal.     ED Results / Procedures / Treatments   Labs (all labs ordered are listed, but only abnormal results  are displayed) Labs Reviewed  BASIC METABOLIC PANEL  CBC  BRAIN NATRIURETIC PEPTIDE  MAGNESIUM  TROPONIN I (HIGH SENSITIVITY)    EKG EKG Interpretation  Date/Time:  Wednesday September 26 2019 22:28:14 EDT Ventricular Rate:  153 PR Interval:    QRS Duration: 162 QT Interval:  292 QTC Calculation: 466 R Axis:   65 Text Interpretation: Atrial fibrillation with rapid ventricular response Non-specific intra-ventricular conduction block Left ventricular hypertrophy ( Sokolow-Lyon , Cornell product ) Abnormal ECG Confirmed by Marianna Fuss ([id number]) on 09/26/2019 10:36:12 PM   Radiology No results found.  Procedures .Critical Care Performed by: Milagros Loll, MD Authorized by: Milagros Loll, MD   Critical care provider statement:    Critical care time (minutes):  45   Critical care was necessary to treat or prevent imminent or life-threatening deterioration of the following conditions:  Cardiac failure and circulatory failure   Critical care was time spent personally by me on the following activities:  Discussions with consultants, evaluation of patient's response to treatment, examination of patient, ordering and performing treatments and interventions, ordering and review of laboratory studies, ordering and review of radiographic studies, pulse oximetry, re-evaluation of patient's condition, obtaining history from patient or surrogate and review of old charts .Cardioversion  Date/Time: 09/27/2019 12:08 AM Performed by: Milagros Loll, MD Authorized by: Milagros Loll, MD   Consent:    Consent obtained:  Verbal and written   Consent given by:  Patient (son)   Risks discussed:  Cutaneous burn   Alternatives discussed:  No treatment, rate-control medication, alternative treatment, referral, observation, delayed treatment and anti-coagulation medication Pre-procedure details:    Rhythm:  Atrial fibrillation   Electrode placement:  Anterior-posterior Patient  sedated: Yes. Refer to sedation procedure documentation for details of sedation.  Attempt one:    Cardioversion mode:  Synchronous   Shock (Joules):  120   Shock outcome:  No change in rhythm Attempt two:    Cardioversion mode:  Synchronous   Shock (Joules):  200   Shock outcome:  No change in rhythm Post-procedure details:    Patient status:  Awake   Patient tolerance of procedure:  Tolerated well, no immediate complications Comments:     Synchronized cardioversion attempt x 2 unsuccessful   .Sedation  Date/Time: 09/27/2019 12:09 AM Performed by: Milagros Loll, MD Authorized by: Milagros Loll, MD   Consent:    Consent obtained:  Verbal and written   Consent given by:  Patient (son)   Risks discussed:  Allergic reaction, dysrhythmia, inadequate sedation, vomiting, nausea, respiratory compromise necessitating ventilatory assistance and intubation, prolonged sedation necessitating reversal and prolonged hypoxia  resulting in organ damage   Alternatives discussed:  Anxiolysis Universal protocol:    Immediately prior to procedure a time out was called: yes     Patient identity confirmation method:  Arm band and verbally with patient Indications:    Procedure performed:  Cardioversion Pre-sedation assessment:    Time since last food or drink:  8 hours   ASA classification: class 2 - patient with mild systemic disease     Mouth opening:  3 or more finger widths   Thyromental distance:  4 finger widths   Mallampati score:  I - soft palate, uvula, fauces, pillars visible   Pre-sedation assessments completed and reviewed: airway patency, cardiovascular function, hydration status, mental status, nausea/vomiting, pain level, respiratory function and temperature   Immediate pre-procedure details:    Reviewed: vital signs     Verified: bag valve mask available, emergency equipment available, intubation equipment available, IV patency confirmed, oxygen available, reversal  medications available and suction available   Procedure details (see MAR for exact dosages):    Preoxygenation:  Room air and nasal cannula   Sedation:  Etomidate   Intended level of sedation: moderate (conscious sedation)   Analgesia:  Fentanyl   Intra-procedure monitoring:  Blood pressure monitoring, cardiac monitor, continuous pulse oximetry, continuous capnometry, frequent vital sign checks and frequent LOC assessments   Intra-procedure events: none     Total Provider sedation time (minutes):  15 Post-procedure details:    Attendance: Constant attendance by certified staff until patient recovered     Recovery: Patient returned to pre-procedure baseline     Post-sedation assessments completed and reviewed: airway patency, cardiovascular function, hydration status, mental status, nausea/vomiting, pain level, respiratory function and temperature     Patient tolerance:  Tolerated well, no immediate complications   (including critical care time)  Medications Ordered in ED Medications  diltiazem (CARDIZEM) 1 mg/mL load via infusion 10 mg (10 mg Intravenous Bolus from Bag 09/26/19 2309)    And  diltiazem (CARDIZEM) 125 mg in dextrose 5% 125 mL (1 mg/mL) infusion (5 mg/hr Intravenous New Bag/Given 09/26/19 2307)  etomidate (AMIDATE) injection 6 mg (has no administration in time range)  fentaNYL (SUBLIMAZE) injection 25 mcg (has no administration in time range)    ED Course  I have reviewed the triage vital signs and the nursing notes.  Pertinent labs & imaging results that were available during my care of the patient were reviewed by me and considered in my medical decision making (see chart for details).  Clinical Course as of Sep 27 2  Wed Sep 26, 2019  2319 Reviewed with cardiology - Delfina Redwood - she states ST segment changes, likely rate related, LVH related; okay to proceed with cardioversion;   [RD]  2349 Attempted cardioversion x2, unsuccessful, will discuss with  cardiology, likely admit hospitalist service   [RD]  Thu Sep 27, 2019  0002 D/w Hassell Done -recommends hospitalist admission, they will follow along, defer anticoagulation question to admitting hospitalist   [RD]    Clinical Course User Index [RD] Lucrezia Starch, MD   MDM Rules/Calculators/A&P                      79 year old lady presented to ER with new onset A. fib.  On arrival here, rate in 150s to 160s.  BP stable.  Son at bedside, physician, very helpful with past medical history.  Patient visiting from [place] where she has all of her medical care.  No prior history of  A. fib, patient had some mild symptoms for last few days but had sudden onset of feeling some chest tightness and short of breath this evening.  Initially started diltiazem for rate control, reviewed with cardiology, Dr. Daphine Deutscher.  Recommended trial of cardioversion.  Discussed risk and benefits in detail with patient and patient's son.  Son felt confident in timeline of onset of A. fib and we proceeded with attempted cardioversion.  Synchronized cardioversion at 120 J unsuccessful followed by attempt at 200 J synchronized cardioversion also unsuccessful.  Will admit to hospital service for further management.  Noted ITP hx and platelets of 30 today - d/w hosp - will defer decision to whether to [facility] to hosp.  Dr. Antionette Char will admit.   Final Clinical Impression(s) / ED Diagnoses Final diagnoses:  Atrial fibrillation, unspecified type [facility])    Rx / DC Orders ED Discharge Orders    None       Milagros Loll, MD 09/27/19 [phone]

## 2019-09-26 NOTE — ED Notes (Signed)
Paged cardio to Dr Stevie Kern

## 2019-09-26 NOTE — ED Triage Notes (Signed)
PT arrives POV for eval of sudden onset blt shoulder pain which woke her from sleep. Son reports that pt was c/o of some SOB, however pt denies. Pt does appear mildly SOB during triage. Tachycardic to 160s, afib rvr on EKG

## 2019-09-27 ENCOUNTER — Inpatient Hospital Stay (HOSPITAL_COMMUNITY): Payer: Medicare Other

## 2019-09-27 ENCOUNTER — Other Ambulatory Visit: Payer: Self-pay

## 2019-09-27 ENCOUNTER — Encounter (HOSPITAL_COMMUNITY): Payer: Self-pay | Admitting: Family Medicine

## 2019-09-27 DIAGNOSIS — D693 Immune thrombocytopenic purpura: Secondary | ICD-10-CM | POA: Diagnosis not present

## 2019-09-27 DIAGNOSIS — E039 Hypothyroidism, unspecified: Secondary | ICD-10-CM | POA: Diagnosis not present

## 2019-09-27 DIAGNOSIS — E871 Hypo-osmolality and hyponatremia: Secondary | ICD-10-CM | POA: Diagnosis not present

## 2019-09-27 DIAGNOSIS — J206 Acute bronchitis due to rhinovirus: Secondary | ICD-10-CM | POA: Diagnosis not present

## 2019-09-27 DIAGNOSIS — Z8249 Family history of ischemic heart disease and other diseases of the circulatory system: Secondary | ICD-10-CM | POA: Diagnosis not present

## 2019-09-27 DIAGNOSIS — I421 Obstructive hypertrophic cardiomyopathy: Secondary | ICD-10-CM | POA: Diagnosis not present

## 2019-09-27 DIAGNOSIS — I422 Other hypertrophic cardiomyopathy: Secondary | ICD-10-CM | POA: Diagnosis not present

## 2019-09-27 DIAGNOSIS — J449 Chronic obstructive pulmonary disease, unspecified: Secondary | ICD-10-CM | POA: Diagnosis present

## 2019-09-27 DIAGNOSIS — I4891 Unspecified atrial fibrillation: Principal | ICD-10-CM

## 2019-09-27 DIAGNOSIS — J439 Emphysema, unspecified: Secondary | ICD-10-CM | POA: Diagnosis not present

## 2019-09-27 DIAGNOSIS — R9431 Abnormal electrocardiogram [ECG] [EKG]: Secondary | ICD-10-CM | POA: Diagnosis not present

## 2019-09-27 DIAGNOSIS — E876 Hypokalemia: Secondary | ICD-10-CM | POA: Diagnosis present

## 2019-09-27 DIAGNOSIS — D696 Thrombocytopenia, unspecified: Secondary | ICD-10-CM | POA: Diagnosis present

## 2019-09-27 DIAGNOSIS — M25511 Pain in right shoulder: Secondary | ICD-10-CM | POA: Diagnosis not present

## 2019-09-27 DIAGNOSIS — I248 Other forms of acute ischemic heart disease: Secondary | ICD-10-CM | POA: Diagnosis not present

## 2019-09-27 DIAGNOSIS — Z20822 Contact with and (suspected) exposure to covid-19: Secondary | ICD-10-CM | POA: Diagnosis not present

## 2019-09-27 DIAGNOSIS — M25512 Pain in left shoulder: Secondary | ICD-10-CM | POA: Diagnosis not present

## 2019-09-27 LAB — URINALYSIS, ROUTINE W REFLEX MICROSCOPIC
Bacteria, UA: NONE SEEN
Bilirubin Urine: NEGATIVE
Glucose, UA: >=500 mg/dL — AB
Hgb urine dipstick: NEGATIVE
Ketones, ur: 5 mg/dL — AB
Leukocytes,Ua: NEGATIVE
Nitrite: NEGATIVE
Protein, ur: NEGATIVE mg/dL
Specific Gravity, Urine: 1.015 (ref 1.005–1.030)
pH: 6 (ref 5.0–8.0)

## 2019-09-27 LAB — COMPREHENSIVE METABOLIC PANEL
ALT: 34 U/L (ref 0–44)
AST: 39 U/L (ref 15–41)
Albumin: 3 g/dL — ABNORMAL LOW (ref 3.5–5.0)
Alkaline Phosphatase: 47 U/L (ref 38–126)
Anion gap: 11 (ref 5–15)
BUN: 10 mg/dL (ref 8–23)
CO2: 22 mmol/L (ref 22–32)
Calcium: 8.3 mg/dL — ABNORMAL LOW (ref 8.9–10.3)
Chloride: 99 mmol/L (ref 98–111)
Creatinine, Ser: 0.63 mg/dL (ref 0.44–1.00)
GFR calc Af Amer: >60 mL/min (ref >60)
GFR calc non Af Amer: >60 mL/min (ref >60)
Glucose, Bld: 170 mg/dL — ABNORMAL HIGH (ref 70–99)
Potassium: 3 mmol/L — ABNORMAL LOW (ref 3.5–5.1)
Sodium: 132 mmol/L — ABNORMAL LOW (ref 135–145)
Total Bilirubin: 1.2 mg/dL (ref 0.3–1.2)
Total Protein: 6.2 g/dL — ABNORMAL LOW (ref 6.5–8.1)

## 2019-09-27 LAB — ECHOCARDIOGRAM COMPLETE
Height: 59 in
Weight: 1499.13 oz

## 2019-09-27 LAB — BASIC METABOLIC PANEL
Anion gap: 6 (ref 5–15)
BUN: 12 mg/dL (ref 8–23)
CO2: 19 mmol/L — ABNORMAL LOW (ref 22–32)
Calcium: 9 mg/dL (ref 8.9–10.3)
Chloride: 106 mmol/L (ref 98–111)
Creatinine, Ser: 0.63 mg/dL (ref 0.44–1.00)
GFR calc Af Amer: >60 mL/min (ref >60)
GFR calc non Af Amer: >60 mL/min (ref >60)
Glucose, Bld: 355 mg/dL — ABNORMAL HIGH (ref 70–99)
Potassium: 4.4 mmol/L (ref 3.5–5.1)
Sodium: 131 mmol/L — ABNORMAL LOW (ref 135–145)

## 2019-09-27 LAB — RESPIRATORY PANEL BY PCR

## 2019-09-27 LAB — STREP PNEUMONIAE URINARY ANTIGEN: Strep Pneumo Urinary Antigen: NEGATIVE

## 2019-09-27 LAB — CBC WITH DIFFERENTIAL/PLATELET
Abs Immature Granulocytes: 0.04 10*3/uL (ref 0.00–0.07)
Basophils Absolute: 0 10*3/uL (ref 0.0–0.1)
Basophils Relative: 0 %
Eosinophils Absolute: 0 10*3/uL (ref 0.0–0.5)
Eosinophils Relative: 0 %
HCT: 38.7 % (ref 36.0–46.0)
Hemoglobin: 12.3 g/dL (ref 12.0–15.0)
Immature Granulocytes: 0 %
Lymphocytes Relative: 14 %
Lymphs Abs: 1.4 10*3/uL (ref 0.7–4.0)
MCH: 28.9 pg (ref 26.0–34.0)
MCHC: 31.8 g/dL (ref 30.0–36.0)
MCV: 90.8 fL (ref 80.0–100.0)
Monocytes Absolute: 0.9 10*3/uL (ref 0.1–1.0)
Monocytes Relative: 9 %
Neutro Abs: 7.7 10*3/uL (ref 1.7–7.7)
Neutrophils Relative %: 77 %
Platelets: 25 10*3/uL — CL (ref 150–400)
RBC: 4.26 MIL/uL (ref 3.87–5.11)
RDW: 12.7 % (ref 11.5–15.5)
WBC: 10.1 10*3/uL (ref 4.0–10.5)
nRBC: 0 % (ref 0.0–0.2)

## 2019-09-27 LAB — TROPONIN I (HIGH SENSITIVITY)
Troponin I (High Sensitivity): 130 ng/L (ref <18)
Troponin I (High Sensitivity): 2595 ng/L (ref <18)
Troponin I (High Sensitivity): 3452 ng/L (ref <18)

## 2019-09-27 LAB — GLUCOSE, CAPILLARY: Glucose-Capillary: 207 mg/dL — ABNORMAL HIGH (ref 70–99)

## 2019-09-27 LAB — IRON AND TIBC
Iron: 23 ug/dL — ABNORMAL LOW (ref 28–170)
Saturation Ratios: 10 % — ABNORMAL LOW (ref 10.4–31.8)
TIBC: 238 ug/dL — ABNORMAL LOW (ref 250–450)
UIBC: 215 ug/dL

## 2019-09-27 LAB — SAVE SMEAR(SSMR), FOR PROVIDER SLIDE REVIEW

## 2019-09-27 LAB — TSH: TSH: 3.571 u[IU]/mL (ref 0.350–4.500)

## 2019-09-27 LAB — SARS CORONAVIRUS 2 (TAT 6-24 HRS): SARS Coronavirus 2: NEGATIVE

## 2019-09-27 LAB — MAGNESIUM: Magnesium: 2.8 mg/dL — ABNORMAL HIGH (ref 1.7–2.4)

## 2019-09-27 LAB — MRSA PCR SCREENING: MRSA by PCR: NEGATIVE

## 2019-09-27 LAB — BRAIN NATRIURETIC PEPTIDE: B Natriuretic Peptide: 479 pg/mL — ABNORMAL HIGH (ref 0.0–100.0)

## 2019-09-27 LAB — FERRITIN: Ferritin: 216 ng/mL (ref 11–307)

## 2019-09-27 MED ORDER — AMIODARONE HCL IN DEXTROSE 360-4.14 MG/200ML-% IV SOLN
60.0000 mg/h | INTRAVENOUS | Status: AC
  Administered 2019-09-27 (×2): 60 mg/h via INTRAVENOUS
  Filled 2019-09-27: qty 200

## 2019-09-27 MED ORDER — ONDANSETRON HCL 4 MG PO TABS: 4.0000 mg | ORAL_TABLET | Freq: Four times a day (QID) | ORAL | Status: DC | PRN

## 2019-09-27 MED ORDER — DEXAMETHASONE SODIUM PHOSPHATE 10 MG/ML IJ SOLN
40.0000 mg | INTRAMUSCULAR | Status: DC
  Administered 2019-09-27: 40 mg via INTRAVENOUS
  Filled 2019-09-27: qty 4

## 2019-09-27 MED ORDER — SODIUM CHLORIDE 0.9% FLUSH
3.0000 mL | Freq: Two times a day (BID) | INTRAVENOUS | Status: DC
  Administered 2019-09-27 – 2019-09-28 (×3): 3 mL via INTRAVENOUS

## 2019-09-27 MED ORDER — HYDROCODONE-ACETAMINOPHEN 5-325 MG PO TABS: 1.0000 | ORAL_TABLET | Freq: Four times a day (QID) | ORAL | Status: DC | PRN

## 2019-09-27 MED ORDER — INSULIN ASPART 100 UNIT/ML ~~LOC~~ SOLN
0.0000 [IU] | Freq: Three times a day (TID) | SUBCUTANEOUS | Status: DC
  Administered 2019-09-28: 2 [IU] via SUBCUTANEOUS

## 2019-09-27 MED ORDER — ACETAMINOPHEN 650 MG RE SUPP: 650.0000 mg | Freq: Four times a day (QID) | RECTAL | Status: DC | PRN

## 2019-09-27 MED ORDER — METOPROLOL TARTRATE 25 MG PO TABS
25.0000 mg | ORAL_TABLET | Freq: Four times a day (QID) | ORAL | Status: DC
  Administered 2019-09-27: 01:00:00 25 mg via ORAL
  Filled 2019-09-27: qty 1

## 2019-09-27 MED ORDER — SODIUM CHLORIDE 0.9% FLUSH
3.0000 mL | Freq: Two times a day (BID) | INTRAVENOUS | Status: DC
  Administered 2019-09-27: 3 mL via INTRAVENOUS

## 2019-09-27 MED ORDER — POTASSIUM CHLORIDE CRYS ER 20 MEQ PO TBCR
40.0000 meq | EXTENDED_RELEASE_TABLET | Freq: Once | ORAL | Status: AC
  Administered 2019-09-27: 40 meq via ORAL
  Filled 2019-09-27: qty 2

## 2019-09-27 MED ORDER — DEXAMETHASONE 4 MG PO TABS
20.0000 mg | ORAL_TABLET | Freq: Every day | ORAL | Status: DC
  Administered 2019-09-28: 20 mg via ORAL
  Filled 2019-09-27: qty 5

## 2019-09-27 MED ORDER — AMIODARONE LOAD VIA INFUSION
150.0000 mg | Freq: Once | INTRAVENOUS | Status: AC
  Administered 2019-09-27: 150 mg via INTRAVENOUS
  Filled 2019-09-27: qty 83.34

## 2019-09-27 MED ORDER — METOPROLOL TARTRATE 5 MG/5ML IV SOLN: 5.0000 mg | INTRAVENOUS | Status: DC | PRN

## 2019-09-27 MED ORDER — AMIODARONE HCL IN DEXTROSE 360-4.14 MG/200ML-% IV SOLN
30.0000 mg/h | INTRAVENOUS | Status: DC
  Administered 2019-09-28: 30 mg/h via INTRAVENOUS
  Filled 2019-09-27 (×2): qty 200

## 2019-09-27 MED ORDER — SODIUM CHLORIDE 0.9% FLUSH: 3.0000 mL | INTRAVENOUS | Status: DC | PRN

## 2019-09-27 MED ORDER — POTASSIUM CHLORIDE CRYS ER 20 MEQ PO TBCR
20.0000 meq | EXTENDED_RELEASE_TABLET | ORAL | Status: DC
  Administered 2019-09-27: 20 meq via ORAL
  Filled 2019-09-27: qty 1

## 2019-09-27 MED ORDER — POTASSIUM CHLORIDE CRYS ER 20 MEQ PO TBCR
20.0000 meq | EXTENDED_RELEASE_TABLET | ORAL | Status: AC
  Administered 2019-09-27 (×2): 20 meq via ORAL
  Filled 2019-09-27 (×2): qty 1

## 2019-09-27 MED ORDER — PANTOPRAZOLE SODIUM 40 MG PO TBEC
40.0000 mg | DELAYED_RELEASE_TABLET | Freq: Every day | ORAL | Status: DC
  Administered 2019-09-27 – 2019-09-28 (×2): 40 mg via ORAL
  Filled 2019-09-27 (×2): qty 1

## 2019-09-27 MED ORDER — METOPROLOL SUCCINATE ER 25 MG PO TB24: 50.0000 mg | ORAL_TABLET | Freq: Every day | ORAL | Status: DC

## 2019-09-27 MED ORDER — ACETAMINOPHEN 325 MG PO TABS: 650.0000 mg | ORAL_TABLET | Freq: Four times a day (QID) | ORAL | Status: DC | PRN

## 2019-09-27 MED ORDER — METOPROLOL SUCCINATE ER 50 MG PO TB24
50.0000 mg | ORAL_TABLET | Freq: Every day | ORAL | Status: DC
  Administered 2019-09-27: 50 mg via ORAL
  Filled 2019-09-27: qty 1

## 2019-09-27 MED ORDER — SODIUM CHLORIDE 0.9 % IV SOLN: 250.0000 mL | INTRAVENOUS | Status: DC | PRN

## 2019-09-27 MED ORDER — LEVALBUTEROL HCL 0.63 MG/3ML IN NEBU: 0.6300 mg | INHALATION_SOLUTION | Freq: Three times a day (TID) | RESPIRATORY_TRACT | Status: DC | PRN

## 2019-09-27 MED ORDER — POTASSIUM CHLORIDE CRYS ER 20 MEQ PO TBCR: 40.0000 meq | EXTENDED_RELEASE_TABLET | Freq: Once | ORAL | Status: DC

## 2019-09-27 MED ORDER — POTASSIUM CHLORIDE 10 MEQ/100ML IV SOLN: 10.0000 meq | INTRAVENOUS | Status: DC

## 2019-09-27 MED ORDER — FUROSEMIDE 10 MG/ML IJ SOLN: 20.0000 mg | Freq: Once | INTRAMUSCULAR | Status: DC

## 2019-09-27 MED ORDER — ONDANSETRON HCL 4 MG/2ML IJ SOLN: 4.0000 mg | Freq: Four times a day (QID) | INTRAMUSCULAR | Status: DC | PRN

## 2019-09-27 NOTE — ED Notes (Signed)
Date and time results received: 09/27/19 02:55   Test: platelet Critical Value: 25  Name of Provider Notified: Opyd  Orders Received? Or Actions Taken?: Orders Received - See Orders for details

## 2019-09-27 NOTE — Progress Notes (Signed)
Heart rate  50's sinus , asymptomatic,  Cardiologist made aware gave order to continue amiodarone drip at 30 mg/hr. Continue to monitor.

## 2019-09-27 NOTE — Progress Notes (Signed)
PROGRESS NOTE  Alyssa Nguyen PZW:[id number] DOB: 11/08/1940 DOA: 09/26/2019 PCP: System, Provider Not In  HPI/Recap of past 24 hours: HPI from Dr. Wess Botts Alyssa Nguyen is a 79 y.o. female with medical history significant for hypothyroidism, emphysema, hypertrophic cardiomyopathy, and ITP, presents to the ED with SOB and bilateral shoulder pain.  Patient is accompanied by her son, a local physician, who assists with the history.  The patient recently traveled from [place] to visit family here, has had some mild dyspnea and malaise for a couple days, but then developed acute discomfort behind both shoulders tonight.  Family used a pulse oximeter and noted that her heart rate was erratic and very fast at times, and brought her into the ED for evaluation of this.  Patient denies any chest pain, fevers, or chills.  She reports having some shortness of breath earlier, mild cough recently.  She has not had any lower extremity swelling or tenderness. Upon arrival to the ED, patient is found to be afebrile, saturating well on room air, heart rate in the 150s, and blood pressure 90s systolic.  EKG features atrial fibrillation with rate 153, nonspecific IVCD, LVH, and repolarization abnormality.  Chest x-ray with mild interstitial edema.  Chemistry panel notable for potassium 3.3 and bicarbonate 19.  CBC with platelets 30,000.  High-sensitivity troponin was initially normal, repeat was about 2595.  Cardiology was consulted by the EDP. 2 unsuccessful attempts at cardioversion were made.  Patient was treated with diltiazem in the ED.  COVID-19 PCR is negative.  Patient admitted for further management.     Today, patient denies any new complaints, denies any chest pain, shortness of breath, abdominal pain, nausea/vomiting, fever/chills.  Stated she will be going back to [facility] middle of April   Assessment/Plan: Principal Problem:   Atrial fibrillation with RVR (HCC) Active Problems:   Thrombocytopenia (HCC)  Hypertrophic cardiomyopathy (HCC)   Hypokalemia   COPD (chronic obstructive pulmonary disease) (HCC)   Hypothyroidism   New onset A. fib with RVR Status post 2 failed attempts at cardioversion in the ED Heart rate in the 150s on presentation TSH WNL EKG showed some ST changes, with rising troponin Chest x-ray showed pulmonary edema likely 2/2 rapid A. Fib Cardiology on board, recommend amiodarone IV for 24 hours and then transition to p.o. load, continue metoprolol Currently not a candidate for anticoagulation given thrombocytopenia, although CHA2DS2-VASc is about 4 Continue to monitor on progressive bed  Elevated troponin/hypertrophic cardiomyopathy 190->2595->3452 Cardiology on board, likely demand ischemia, although not a candidate for any invasive procedures Echo pending No need for aspirin or heparin for now due to thrombocytopenia  Likely acute on chronic ITP Diagnosed in 2020 in OSH in [place] by hematologist, has been observed since then as platelets have been above 30,000, although slowly trending down Unknown trigger, unlikely URI due to afebrile, no leukocytosis, chest x-ray x 2 negative for infection, COVID-19 negative, respiratory viral panel pending, UA/UC pending Recommended to start dexamethasone for 4 days if platelet count is less than 30,000 Continue dexamethasone daily Hematologist consulted, spoke to Dr. Myrle Sheng, will see patient  Hyponatremia History of cardiomyopathy Daily BMP  Hypokalemia Replace as needed  Hypothyroidism Continue Synthroid  COPD Stable Xopenex as needed        Malnutrition Type:      Malnutrition Characteristics:      Nutrition Interventions:       Estimated body mass index is 18.92 kg/m as calculated from the following:   Height as of this  encounter: 4\' 11"  (1.499 m).   Weight as of this encounter: 42.5 kg.     Code Status: Full  Family Communication: Discussed with son over the phone who is retinal  specialist on 09/27/2019  Disposition Plan: Patient came from home, plan to DC home.  Patient still requiring IV amiodarone, steroids, cardiology/heme-onc sign off, improvement in platelets   Consultants:  Cardiology  Heme-onc  Procedures:  None  Antimicrobials:  None  DVT prophylaxis: SCDs due to significant thrombocytopenia   Objective: Vitals:   09/27/19 0230 09/27/19 0300 09/27/19 0330 09/27/19 0406  BP: 107/83 (!) 101/39 97/68 (!) 140/97  Pulse: (!) 57 79 77 79  Resp: (!) 22 (!) 23 19 20   Temp:    98 F (36.7 C)  TempSrc:    Oral  SpO2: 96% 96% 96% 100%  Weight:    42.5 kg  Height:    4\' 11"  (1.499 m)    Intake/Output Summary (Last 24 hours) at 09/27/2019 1109 Last data filed at 09/27/2019 0104 Gross per 24 hour  Intake --  Output 300 ml  Net -300 ml   Filed Weights   09/26/19 2236 09/27/19 0406  Weight: 43.1 kg 42.5 kg    Exam:  General: NAD   Cardiovascular: S1, S2 present  Respiratory:  Noted rhonchi bilaterally  Abdomen: Soft, nontender, nondistended, bowel sounds present  Musculoskeletal: No bilateral pedal edema noted  Skin: Normal  Psychiatry: Normal mood   Data Reviewed: CBC: Recent Labs  Lab 09/26/19 2239 09/27/19 0139  WBC 9.8 10.1  NEUTROABS  --  7.7  HGB 13.1 12.3  HCT 40.8 38.7  MCV 88.5 90.8  PLT 30* 25*   Basic Metabolic Panel: Recent Labs  Lab 09/26/19 2239 09/27/19 0139  NA 134* 132*  K 3.3* 3.0*  CL 100 99  CO2 19* 22  GLUCOSE 148* 170*  BUN 9 10  CREATININE 0.60 0.63  CALCIUM 9.4 8.3*  MG 1.8 2.8*   GFR: Estimated Creatinine Clearance: 40.1 mL/min (by C-G formula based on SCr of 0.63 mg/dL). Liver Function Tests: Recent Labs  Lab 09/27/19 0139  AST 39  ALT 34  ALKPHOS 47  BILITOT 1.2  PROT 6.2*  ALBUMIN 3.0*   No results for input(s): LIPASE, AMYLASE in the last 168 hours. No results for input(s): AMMONIA in the last 168 hours. Coagulation Profile: No results for input(s): INR, PROTIME in  the last 168 hours. Cardiac Enzymes: No results for input(s): CKTOTAL, CKMB, CKMBINDEX, TROPONINI in the last 168 hours. BNP (last 3 results) No results for input(s): PROBNP in the last 8760 hours. HbA1C: No results for input(s): HGBA1C in the last 72 hours. CBG: No results for input(s): GLUCAP in the last 168 hours. Lipid Profile: No results for input(s): CHOL, HDL, LDLCALC, TRIG, CHOLHDL, LDLDIRECT in the last 72 hours. Thyroid Function Tests: Recent Labs    09/27/19 0139  TSH 3.571   Anemia Panel: No results for input(s): VITAMINB12, FOLATE, FERRITIN, TIBC, IRON, RETICCTPCT in the last 72 hours. Urine analysis: No results found for: COLORURINE, APPEARANCEUR, LABSPEC, PHURINE, GLUCOSEU, HGBUR, BILIRUBINUR, KETONESUR, PROTEINUR, UROBILINOGEN, NITRITE, LEUKOCYTESUR Sepsis Labs: @LABRCNTIP (procalcitonin:4,lacticidven:4)  ) Recent Results (from the past 240 hour(s))  SARS CORONAVIRUS 2 (TAT 6-24 HRS) Nasopharyngeal Nasopharyngeal Swab     Status: None   Collection Time: 09/27/19 12:08 AM   Specimen: Nasopharyngeal Swab  Result Value Ref Range Status   SARS Coronavirus 2 NEGATIVE NEGATIVE Final    Comment: (NOTE) SARS-CoV-2 target nucleic acids are NOT DETECTED.  The SARS-CoV-2 RNA is generally detectable in upper and lower respiratory specimens during the acute phase of infection. Negative results do not preclude SARS-CoV-2 infection, do not rule out co-infections with other pathogens, and should not be used as the sole basis for treatment or other patient management decisions. Negative results must be combined with clinical observations, patient history, and epidemiological information. The expected result is Negative. Fact Sheet for Patients: SugarRoll.be Fact Sheet for Healthcare Providers: https://www.woods-mathews.com/ This test is not yet approved or cleared by the [place] FDA and  has been authorized for detection and/or  diagnosis of SARS-CoV-2 by FDA under an Emergency Use Authorization (EUA). This EUA will remain  in effect (meaning this test can be used) for the duration of the COVID-19 declaration under Section 56 4(b)(1) of the Act, 21 U.S.C. section 360bbb-3(b)(1), unless the authorization is terminated or revoked sooner. Performed at [facility], [place] [address], [place], [place] [address]   MRSA PCR Screening     Status: None   Collection Time: 09/27/19  4:07 AM   Specimen: Nasopharyngeal  Result Value Ref Range Status   MRSA by PCR NEGATIVE NEGATIVE Final    Comment:        The GeneXpert MRSA Assay (FDA approved for NASAL specimens only), is one component of a comprehensive MRSA colonization surveillance program. It is not intended to diagnose MRSA infection nor to guide or monitor treatment for MRSA infections. Performed at [facility], [place] [address], [place], [place] [address]       Studies: DG Chest Port 1 View  Result Date: 09/27/2019 CLINICAL DATA:  Pulmonary edema EXAM: PORTABLE CHEST 1 VIEW COMPARISON:  09/26/2019 FINDINGS: External pacer paddles are noted. The cardiac silhouette, mediastinal and hilar contours are within normal limits and stable. The lungs show improved aeration with resolving edema and atelectasis. No definite pleural effusions. No pneumothorax. IMPRESSION: Improving lung aeration with resolving edema and atelectasis. Electronically Signed   By: Marijo Sanes M.D.   On: 09/27/2019 11:00   DG Chest Portable 1 View  Result Date: 09/26/2019 CLINICAL DATA:  Shortness of breath EXAM: PORTABLE CHEST 1 VIEW COMPARISON:  None. FINDINGS: Cardiac shadow is enlarged. Aortic calcifications are noted. Mild interstitial edema is seen. No focal confluent infiltrate is noted. No bony abnormality is seen. IMPRESSION: Mild interstitial edema. Electronically Signed   By: Inez Catalina M.D.   On: 09/26/2019 23:26    Scheduled Meds: . dexamethasone (DECADRON)  injection  40 mg Intravenous Q24H  . metoprolol succinate  50 mg Oral Daily  . pantoprazole  40 mg Oral Daily  . potassium chloride  40 mEq Oral Once  . sodium chloride flush  3 mL Intravenous Q12H  . sodium chloride flush  3 mL Intravenous Q12H    Continuous Infusions: . sodium chloride    . amiodarone 60 mg/hr (09/27/19 1010)   Followed by  . amiodarone       LOS: 0 days     Alma Friendly, MD [facility]  If 7PM-7AM, please contact night-coverage [email or web address] 09/27/2019, 11:09 AM

## 2019-09-27 NOTE — Consult Note (Signed)
Cardiology Consultation:   Patient ID: Alyssa Nguyen MRN: [id number]; DOB: [birth year]  Admit date: 09/26/2019 Date of Consult: 09/27/2019  Primary Care Provider: System, Provider Not In Primary Cardiologist: Senaida Lange Primary Electrophysiologist:  None    Patient Profile:   Alyssa Nguyen is a 79 y.o. female with a hx of HCM, followed in [place] normally (where her primary cardiologist is at [facility]) who is being seen today for the evaluation of new-onset afib w/ RVR at the request of [facility] (Dr. Roslynn Amble).  History of Present Illness:   Alyssa Nguyen was admitted in [place] w/ influenza in Jan 2020 and incidentally found to have LVH, chordal SAM and LVOT obstruction (peak gradient 57mmHg that increased up to 122mmHg with Valsalva) On echocardiogram done at that time. She subsequently had cMRI showing ASH with SAM. She did have genetic testing done which was apparently negative per cardiology notes. She presented to the ED tonight after sudden, rest-onset chest and shoulder pain which woke her up from sleep with some mild associated SOB.  Son reports that he had been periodically checking her oxygen as well as heart rate at home.  Over the last couple days has had normal heart rate and normal oxygen levels.  This evening noted heart rate up to 150s and then would spontaneously come back down to 80s. Her son says she has had some sort of respiratory illness for the past 2 days associated with cough. She was found to be in afib w/ RVR in the ED, with HR in the 150s-160s. There is diffuse ST depression, multiple leads. We do not have any old tracings for comparison, although narrative report from old tracing indicates LVH with associated repolarization abnormalities at baseline. Initial HS trop is negative at 15. ED attempted DCCV; per ED attending, cardioversion was attempted at two different voltages up to 200J and was unsuccessful at restoring NSR. She has now been  started on cardizem gtt for rate control.    Past Medical History:  Diagnosis Date  . Anemia  . Asthma  . Hypothyroid 06/29/2017  . LVH (left ventricular hypertrophy)  ITP w/ chronic thrombocytopenia  Past Surgical History:  Procedure Laterality Date  . UPPER GASTROINTESTINAL ENDOSCOPY 06/29/2018    Home Medications:  Prior to Admission medications   Medication Sig Start Date End Date Taking? Authorizing Provider  albuterol (PROVENTIL HFA;VENTOLIN HFA) 108 (90 Base) MCG/ACT inhaler Inhale 1-2 puffs into the lungs every 6 (six) hours as needed for wheezing or shortness of breath. 12/27/16   Tasia Catchings, Amy V, PA-C  azithromycin (ZITHROMAX) 250 MG tablet Take by mouth daily.    [provider]  predniSONE (STERAPRED UNI-PAK 21 TAB) 10 MG (21) TBPK tablet Take by mouth daily. Take 6 tabs by mouth day 1, then 5 tabs, then 4 tabs, then 3 tabs, 2 tabs, then 1 tab for the last day 12/27/16   Ok Edwards, PA-C    Inpatient Medications: Scheduled Meds:  Continuous Infusions: . diltiazem (CARDIZEM) infusion Stopped (09/26/19 2334)  . magnesium sulfate bolus IVPB     PRN Meds:   Allergies:   No Known Allergies  Social History:   Social History   Socioeconomic History  . Marital status: Widowed    Spouse name: Not on file  . Number of children: Not on file  . Years of education: Not on file  . Highest education level: Not on file  Occupational History  . Not on file  Tobacco Use  . Smoking  status: Never Smoker  . Smokeless tobacco: Never Used  Substance and Sexual Activity  . Alcohol use: No  . Drug use: No  . Sexual activity: Not on file  Other Topics Concern  . Not on file  Social History Narrative  . Not on file   Social Determinants of Health   Financial Resource Strain:   . Difficulty of Paying Living Expenses:   Food Insecurity:   . Worried About Programme researcher, broadcasting/film/video in the Last Year:   . Barista in the Last Year:   Transportation Needs:   . Automotive engineer (Medical):   Marland Kitchen Lack of Transportation (Non-Medical):   Physical Activity:   . Days of Exercise per Week:   . Minutes of Exercise per Session:   Stress:   . Feeling of Stress :   Social Connections:   . Frequency of Communication with Friends and Family:   . Frequency of Social Gatherings with Friends and Family:   . Attends Religious Services:   . Active Member of Clubs or Organizations:   . Attends Banker Meetings:   Marland Kitchen Marital Status:   Intimate Partner Violence:   . Fear of Current or Ex-Partner:   . Emotionally Abused:   Marland Kitchen Physically Abused:   . Sexually Abused:     Family History:   Family History  Problem Relation Age of Onset  . Sudden Death Brother  . Heart Attack Brother    ROS:  Please see the history of present illness.   All other ROS reviewed and negative.     Physical Exam/Data:   Vitals:   09/26/19 2339 09/26/19 2344 09/26/19 2348 09/26/19 2351  BP: 116/67 97/71 101/71 104/73  Pulse: 86 87 (!) 51 74  Resp: (!) 27 (!) 23 (!) 25 (!) 22  Temp:      SpO2: 98% 99% 100% 100%  Weight:      Height:       No intake or output data in the 24 hours ending 09/27/19 0003 Last 3 Weights 09/26/2019  Weight (lbs) 95 lb  Weight (kg) 43.092 kg     Body mass index is 19.19 kg/m.  General:  Well nourished, well developed, in no acute distress HEENT: normal Lymph: no adenopathy Neck: no JVD Endocrine:  No thryomegaly Vascular: No carotid bruits; DP pulses 2+ bilaterally without bruits  Cardiac:  normal S1, S2; irreg irreg; 2-3/6 sys murmur at the base  Lungs:  Bilateral wheezing, R>L Abd: soft, nontender, no hepatomegaly  Ext: no edema Musculoskeletal:  No deformities, BUE and BLE strength normal and equal Skin: warm and dry  Neuro:  CNs 2-12 intact, no focal abnormalities noted Psych:  Normal affect   EKG:  The EKG was personally reviewed and demonstrates:  afib w/ RVR, HR 150s, LVH w/ repol changes, diffuse ST depression multiple  leads Telemetry:  Telemetry was personally reviewed and demonstrates:  afib  Relevant CV Studies: TTE 06-29-18 CONCLUSIONS: -The left ventricle is normal in size. There is moderate concentric left ventricular hypertrophy. Left ventricular systolic function is hyperdynamic. EF = 80% (visual est.) Left ventricular diastolic function is consistent with abnormal relaxation (stage I). -The right ventricle is normal in size. Right ventricular systolic function is normal. The estimated right ventricular systolic pressure is 29.4 mmHg plus the right atrial pressure. The estimated right ventricular systolic pressure is 32 mmHg. The right atrial pressure is 3 mmHg. Finding is consistent with normal pulmonary artery pressures. -There is  moderate mitral valve regurgitation likely related to chordal SAM. -There is mild tricuspid valve regurgitation. -Chordal SAM causing LVOT obstruction and moderate mitral regurgitation in the setting of septal hypertrophy and hyperdynamic LV function. LVOT obstruction with SAM peak gradient of at rest that increases to 102 mmHg with valsalva.  Holter Monitor 09/01/18  Symptom Finding Correlation: The rhythm strips do not correlate with the symptoms. The holter study was abnormal.   1) The predominant rhythm was sinus rhythm   2) Nineteen (19 )Supraventricular Tachycardia runs occurred, the run with the fastest interval lasting 4 beats with a max rate of 200 bpm, the longest lasting 18 beats with an avg rate of 125 bpm.  3) Isolated SVEswere occasional (2.3%, 94801), SVE Couplets were rare (<1.0%, 408), and SVETriplets were rare (<1.0%, 21).   4) Isolated VEs were rare (<1.0%), and no VE Couplets or VE Triplets were present. Ventricular Bigeminy was present.   5) No symptoms with triggered events  Cardiac MRI 12/06/18 IMPRESSION: 1. Normal left ventricular size and function with LV ejection fraction calculated at 71%. There are no regional wall motion  abnormalities. 2. Normal right ventricular size and function with RV ejection fraction calculated at 65%. 3. There is severe hypertrophy of the basal anteroseptal wall which measures up to 17 mm. There is systolic anterior motion (SAM) of the mitral valve seen.  4. There is no evidence of abnormal delayed enhancement in the left ventricle to suggest fibrous changes / scarring. However some of the basal slices are missing from the delayed enhancement series.  Genetic Testing 01/24/19 The results are NEGATIVE.   Laboratory Data:  High Sensitivity Troponin:   Recent Labs  Lab 09/26/19 2239  TROPONINIHS 15     Chemistry Recent Labs  Lab 09/26/19 2239  NA 134*  K 3.3*  CL 100  CO2 19*  GLUCOSE 148*  BUN 9  CREATININE 0.60  CALCIUM 9.4  GFRNONAA >60  GFRAA >60  ANIONGAP 15    No results for input(s): PROT, ALBUMIN, AST, ALT, ALKPHOS, BILITOT in the last 168 hours. Hematology Recent Labs  Lab 09/26/19 2239  WBC 9.8  RBC 4.61  HGB 13.1  HCT 40.8  MCV 88.5  MCH 28.4  MCHC 32.1  RDW 12.5  PLT 30*   BNPNo results for input(s): BNP, PROBNP in the last 168 hours.  DDimer No results for input(s): DDIMER in the last 168 hours.   Radiology/Studies:  DG Chest Portable 1 View  Result Date: 09/26/2019 CLINICAL DATA:  Shortness of breath EXAM: PORTABLE CHEST 1 VIEW COMPARISON:  None. FINDINGS: Cardiac shadow is enlarged. Aortic calcifications are noted. Mild interstitial edema is seen. No focal confluent infiltrate is noted. No bony abnormality is seen. IMPRESSION: Mild interstitial edema. Electronically Signed   By: Alcide Clever M.D.   On: 09/26/2019 23:26         Assessment and Plan:   1. Afib: new-dx, new-onset <24h. DCCV attempted in ED but unsuccessful. Rate control has been initiated w/ diltiazem IV gtt, but it was subsequently stopped due to hypotension; notes from primary cardiologist say that pt has not tolerated cardizem previously, but reason for that is not clear.  She is on toprol XL 50 as OP. Would try to transition to BB from the CCB if possible (can start w/ metoprolol 25mg  PO q6h). would consider getting EP involved; they may want to add anti-arrhythmic and/or re-attempt DCCV on anti-arrhythmic therapy. TEE may be questionable given low plt. Anti-coagulation indicated, however  plt are 30 given ITP. Recommend heparin IV gtt if not felt to be contraindicated by medicine team.  2. EKG changes: initial trop negative. Would cont to cycle HS trop. It will likely rise at least some degree due to RVR and demand. pt has significant LVH and abnormal baseline EKG. Demand from RVR in setting of LVH likely exacerbating baseline EKG changes. She has had some chest discomfort, which may be due to afib/RVR but I do think some sort of ischemia evaluation is ultimately warranted; LHC may not be optimal choice given plt of 30. Consider coronary CTA vs nuclear stress test to look for ischemia. 3. HCM: genetic testing negative; cMRI did not show any significant intramyocardial abnormalities, however there is some degree of LVOT obstruction. Will order TTE. Cardiology notes indicate pt is on toprol XL 50mg  daily at home for negative inotropy; apparently she did not tolerate PO diltiazem but the exact reason for that is not clear. I think using PO metoprolol is an acceptable plan; start w/ 25mg  PO q6h and up-titrate from there. 4. ITP: mgmt as per medicine team      For questions or updates, please contact [facility] Please consult [email or web address] for contact info under     Signed, , MD, [facility] 09/27/2019 12:03 AM

## 2019-09-27 NOTE — ED Notes (Signed)
Date and time results received: 09/27/19 03:29  Test: troponin Critical Value: 130 Name of Provider Notified: Opyd  Orders Received? Or Actions Taken?: Orders Received - See Orders for details

## 2019-09-27 NOTE — Progress Notes (Signed)
Spoke with microbiology to add on strep. Antigen to the previous urine specimen.

## 2019-09-27 NOTE — H&P (Addendum)
History and Physical    Alyssa Nguyen VOH:[id number] DOB: 1941/01/20 DOA: 09/26/2019  PCP: System, Provider Not In   Patient coming from: Home   Chief Complaint: SOB, pain in shoulders   HPI: Alyssa Nguyen is a 79 y.o. female with medical history significant for hypothyroidism, emphysema, hypertrophic cardiomyopathy, and ITP, now presenting to the emergency department with shortness of breath and bilateral shoulder pain.  Patient is accompanied by her son, and a local physician, who assists with the history.  The patient recently traveled from [place] to visit family here, has had some mild dyspnea and malaise for a couple days, but then developed acute discomfort behind both shoulders tonight.  Family used a pulse oximeter and noted that her heart rate was erratic and very fast at times, and brought her into the ED for evaluation of this.  Patient denies any chest pain, fevers, or chills.  She reports having some shortness of breath earlier, but not now, and reports a mild cough recently.  She has not had any lower extremity swelling or tenderness.  ED Course: Upon arrival to the ED, patient is found to be afebrile, saturating well on room air, heart rate in the 150s, and blood pressure 90s systolic.  EKG features atrial fibrillation with rate 153, nonspecific IVCD, LVH, and repolarization abnormality.  Chest x-ray with mild interstitial edema.  Chemistry panel notable for potassium 3.3 and bicarbonate 19.  CBC with platelets 30,000.  High-sensitivity troponin is normal.  Cardiology was consulted by the ED physician.  2 unsuccessful attempts at cardioversion were made.  Patient was treated with diltiazem in the ED.  COVID-19 PCR is pending.   Review of Systems:  All other systems reviewed and apart from HPI, are negative.  History reviewed. No pertinent past medical history.  History reviewed. No pertinent surgical history.   reports that she has never smoked. She has never used smokeless  tobacco. She reports that she does not drink alcohol or use drugs.  No Known Allergies  History reviewed. No pertinent family history.   Prior to Admission medications   Medication Sig Start Date End Date Taking? Authorizing Provider  albuterol (PROVENTIL HFA;VENTOLIN HFA) 108 (90 Base) MCG/ACT inhaler Inhale 1-2 puffs into the lungs every 6 (six) hours as needed for wheezing or shortness of breath. 12/27/16   Cathie Hoops, Amy V, PA-C  azithromycin (ZITHROMAX) 250 MG tablet Take by mouth daily.    [provider]  predniSONE (STERAPRED UNI-PAK 21 TAB) 10 MG (21) TBPK tablet Take by mouth daily. Take 6 tabs by mouth day 1, then 5 tabs, then 4 tabs, then 3 tabs, 2 tabs, then 1 tab for the last day 12/27/16   Lurline Idol    Physical Exam: Vitals:   09/26/19 2351 09/26/19 2357 09/27/19 0000 09/27/19 0003  BP: 104/73 117/78 112/82 109/74  Pulse: 74 100 81 91  Resp: (!) 22 (!) 22 (!) 23 (!) 22  Temp:      SpO2: 100% 100% 99% 99%  Weight:      Height:        Constitutional: NAD, calm  Eyes: PERTLA, lids and conjunctivae normal ENMT: Mucous membranes are moist. Posterior pharynx clear of any exudate or lesions.   Neck: normal, supple, no masses, no thyromegaly Respiratory: Occasional wheeze. Speaking full sentences. No pallor or cyanosis.    Cardiovascular: Rate ~120 and irregular. No extremity edema. No significant JVD. Abdomen: No distension, no tenderness, soft. Bowel sounds active.  Musculoskeletal: no  clubbing / cyanosis. No joint deformity upper and lower extremities.   Skin: no significant rashes, lesions, ulcers. Warm, dry, well-perfused. Neurologic: No gross facial asymmetry. Sensation intact. Moving all extremities.   Psychiatric: Alert and oriented to person, place, and situation. Very pleasant and cooperative.    Labs and Imaging on Admission: I have personally reviewed following labs and imaging studies  CBC: Recent Labs  Lab 09/26/19 2239  WBC 9.8  HGB 13.1  HCT  40.8  MCV 88.5  PLT 30*   Basic Metabolic Panel: Recent Labs  Lab 09/26/19 2239  NA 134*  K 3.3*  CL 100  CO2 19*  GLUCOSE 148*  BUN 9  CREATININE 0.60  CALCIUM 9.4  MG 1.8   GFR: Estimated Creatinine Clearance: 40.7 mL/min (by C-G formula based on SCr of 0.6 mg/dL). Liver Function Tests: No results for input(s): AST, ALT, ALKPHOS, BILITOT, PROT, ALBUMIN in the last 168 hours. No results for input(s): LIPASE, AMYLASE in the last 168 hours. No results for input(s): AMMONIA in the last 168 hours. Coagulation Profile: No results for input(s): INR, PROTIME in the last 168 hours. Cardiac Enzymes: No results for input(s): CKTOTAL, CKMB, CKMBINDEX, TROPONINI in the last 168 hours. BNP (last 3 results) No results for input(s): PROBNP in the last 8760 hours. HbA1C: No results for input(s): HGBA1C in the last 72 hours. CBG: No results for input(s): GLUCAP in the last 168 hours. Lipid Profile: No results for input(s): CHOL, HDL, LDLCALC, TRIG, CHOLHDL, LDLDIRECT in the last 72 hours. Thyroid Function Tests: No results for input(s): TSH, T4TOTAL, FREET4, T3FREE, THYROIDAB in the last 72 hours. Anemia Panel: No results for input(s): VITAMINB12, FOLATE, FERRITIN, TIBC, IRON, RETICCTPCT in the last 72 hours. Urine analysis: No results found for: COLORURINE, APPEARANCEUR, LABSPEC, PHURINE, GLUCOSEU, HGBUR, BILIRUBINUR, KETONESUR, PROTEINUR, UROBILINOGEN, NITRITE, LEUKOCYTESUR Sepsis Labs: @LABRCNTIP (procalcitonin:4,lacticidven:4) )No results found for this or any previous visit (from the past 240 hour(s)).   Radiological Exams on Admission: DG Chest Portable 1 View  Result Date: 09/26/2019 CLINICAL DATA:  Shortness of breath EXAM: PORTABLE CHEST 1 VIEW COMPARISON:  None. FINDINGS: Cardiac shadow is enlarged. Aortic calcifications are noted. Mild interstitial edema is seen. No focal confluent infiltrate is noted. No bony abnormality is seen. IMPRESSION: Mild interstitial edema.  Electronically Signed   By: 09/28/2019 M.D.   On: 09/26/2019 23:26    EKG: Independently reviewed. Atrial fibrillation with RVR, rate 153, non-specific IVCD, LVH, repolarization abnormality.   Assessment/Plan   1. Atrial fibrillation RVR  - Presents with bilateral shoulder pain after a couple days of mild SOB and malaise, is found to have new AF with rate in 150s  - Electrical cardioversion unsuccessful in ED - Cardiology consulting and much appreciated  - CHADS-VASc is 3-4 (age x2, gender, ?CHF); HAS-BLED is 2 - Correct hypokalemia, check TSH, check echo, rate-control with oral Lopressor, hesitant to start anticoagulation with platelets only 30,000 can consider starting steroid for likely ITP to get >50k and/or hematology consultation, will repeat CBC in am   2. Thrombocytopenia  - Platelets 30,000 in ED with normal WBC, normal H&H, and no bleeding  - She follows with hematology in 09/28/2019 for suspected ITP, planned to continue observation with treatment (dexamethasone) only if she bleeds or has platelets <30k  - Repeat CBC in am, consider initiating steroids and/or hematology consultation    ADDENDUM:  Repeat CBC with platelets 25k this am without bleeding. Plan to start dexamethasone.    3. Hypertrophic cardiomyopathy  -  Followed by cardiology near her home in [place], [place] and managed with metoprolol   4. Hypothyroidism  - Pharmacy medication-reconciliation pending  - Checking TSH as above    5. Hypokalemia  - Replace, repeat chem panel in am    6. COPD  - Stable  - Xoponex as needed    DVT prophylaxis: SCDs Code Status: Full  Family Communication: Son updated at bedside Disposition Plan: Likely home in 2-3 days pending stable HR on oral medications and cardiology clearance.  Consults called: Cardiology  Admission status:  Inpatient    Vianne Bulls, MD [facility] Pager: See [email or web address]  If 7AM-7PM, please contact the daytime attending [email or web address]   09/27/2019, 12:50 AM

## 2019-09-27 NOTE — Progress Notes (Signed)
Cardiology Progress Note  Patient ID: Alyssa Nguyen MRN: [id number] DOB: [birth year] Date of Encounter: 09/27/2019  Primary Cardiologist: No primary care provider on file.  Subjective  In and out of Afib. Has had respiratory virus recently.   ROS:  All other ROS reviewed and negative. Pertinent positives noted in the HPI.     Inpatient Medications  Scheduled Meds: . amiodarone  150 mg Intravenous Once  . dexamethasone (DECADRON) injection  40 mg Intravenous Q24H  . metoprolol succinate  50 mg Oral Daily  . pantoprazole  40 mg Oral Daily  . potassium chloride  20 mEq Oral Q4H  . sodium chloride flush  3 mL Intravenous Q12H  . sodium chloride flush  3 mL Intravenous Q12H   Continuous Infusions: . sodium chloride    . amiodarone     Followed by  . amiodarone     PRN Meds: sodium chloride, acetaminophen **OR** acetaminophen, HYDROcodone-acetaminophen, levalbuterol, ondansetron **OR** ondansetron (ZOFRAN) IV, sodium chloride flush   Vital Signs   Vitals:   09/27/19 0230 09/27/19 0300 09/27/19 0330 09/27/19 0406  BP: 107/83 (!) 101/39 97/68 (!) 140/97  Pulse: (!) 57 79 77 79  Resp: (!) 22 (!) 23 19 20   Temp:    98 F (36.7 C)  TempSrc:    Oral  SpO2: 96% 96% 96% 100%  Weight:    42.5 kg  Height:    4\' 11"  (1.499 m)    Intake/Output Summary (Last 24 hours) at 09/27/2019 0908 Last data filed at 09/27/2019 0104 Gross per 24 hour  Intake --  Output 300 ml  Net -300 ml   Last 3 Weights 09/27/2019 09/26/2019  Weight (lbs) 93 lb 11.1 oz 95 lb  Weight (kg) 42.5 kg 43.092 kg      Telemetry  Overnight telemetry shows Afib with intermittent NSR, which I personally reviewed.   ECG  The most recent ECG shows Afib with RVR 150 bpm with diffuse ST depressions, which I personally reviewed.   ECG this AM back in NSR.   Physical Exam   Vitals:   09/27/19 0230 09/27/19 0300 09/27/19 0330 09/27/19 0406  BP: 107/83 (!) 101/39 97/68 (!) 140/97  Pulse: (!) 57 79 77 79  Resp: (!) 22  (!) 23 19 20   Temp:    98 F (36.7 C)  TempSrc:    Oral  SpO2: 96% 96% 96% 100%  Weight:    42.5 kg  Height:    4\' 11"  (1.499 m)     Intake/Output Summary (Last 24 hours) at 09/27/2019 0908 Last data filed at 09/27/2019 0104 Gross per 24 hour  Intake --  Output 300 ml  Net -300 ml    Last 3 Weights 09/27/2019 09/26/2019  Weight (lbs) 93 lb 11.1 oz 95 lb  Weight (kg) 42.5 kg 43.092 kg    Body mass index is 18.92 kg/m.   General: Well nourished, well developed, in no acute distress Head: Atraumatic, normal size  Eyes: PEERLA, EOMI  Neck: Supple, no JVD Endocrine: No thryomegaly Cardiac: harsh 3/6 SEM Lungs: rhonchi bilaterally  Abd: Soft, nontender, no hepatomegaly  Ext: No edema, pulses 2+ Musculoskeletal: No deformities, BUE and BLE strength normal and equal Skin: Warm and dry, no rashes   Neuro: Alert and oriented to person, place, time, and situation, CNII-XII grossly intact, no focal deficits  Psych: Normal mood and affect   Labs  High Sensitivity Troponin:   Recent Labs  Lab 09/26/19 2239 09/27/19 0139 09/27/19 0536  TROPONINIHS  15 130* 2,595*     Cardiac EnzymesNo results for input(s): TROPONINI in the last 168 hours. No results for input(s): TROPIPOC in the last 168 hours.  Chemistry Recent Labs  Lab 09/26/19 2239 09/27/19 0139  NA 134* 132*  K 3.3* 3.0*  CL 100 99  CO2 19* 22  GLUCOSE 148* 170*  BUN 9 10  CREATININE 0.60 0.63  CALCIUM 9.4 8.3*  PROT  --  6.2*  ALBUMIN  --  3.0*  AST  --  39  ALT  --  34  ALKPHOS  --  47  BILITOT  --  1.2  GFRNONAA >60 >60  GFRAA >60 >60  ANIONGAP 15 11    Hematology Recent Labs  Lab 09/26/19 2239 09/27/19 0139  WBC 9.8 10.1  RBC 4.61 4.26  HGB 13.1 12.3  HCT 40.8 38.7  MCV 88.5 90.8  MCH 28.4 28.9  MCHC 32.1 31.8  RDW 12.5 12.7  PLT 30* 25*   BNP Recent Labs  Lab 09/26/19 2239  BNP 479.0*    DDimer No results for input(s): DDIMER in the last 168 hours.   Radiology  DG Chest Portable 1  View  Result Date: 09/26/2019 CLINICAL DATA:  Shortness of breath EXAM: PORTABLE CHEST 1 VIEW COMPARISON:  None. FINDINGS: Cardiac shadow is enlarged. Aortic calcifications are noted. Mild interstitial edema is seen. No focal confluent infiltrate is noted. No bony abnormality is seen. IMPRESSION: Mild interstitial edema. Electronically Signed   By: Inez Catalina M.D.   On: 09/26/2019 23:26    Cardiac Studies  TTE 06-29-18 CONCLUSIONS: -The left ventricle is normal in size. There is moderate concentric left ventricular hypertrophy. Left ventricular systolic function is hyperdynamic. EF = 80% (visual est.) Left ventricular diastolic function is consistent with abnormal relaxation (stage I). -The right ventricle is normal in size. Right ventricular systolic function is normal. The estimated right ventricular systolic pressure is 06.2 mmHg plus the right atrial pressure. The estimated right ventricular systolic pressure is 32 mmHg. The right atrial pressure is 3 mmHg. Finding is consistent with normal pulmonary artery pressures. -There is moderate mitral valve regurgitation likely related to chordal SAM. -There is mild tricuspid valve regurgitation. -Chordal SAM causing LVOT obstruction and moderate mitral regurgitation in the setting of septal hypertrophy and hyperdynamic LV function. LVOT obstruction with SAM peak gradient of 64mmHg at rest that increases to 102 mmHg with valsalva.  Holter Monitor 09/01/18  Symptom Finding Correlation: The rhythm strips do not correlate with the symptoms. The holter study was abnormal.   1) The predominant rhythm was sinus rhythm   2) Nineteen (19 )Supraventricular Tachycardia runs occurred, the run with the fastest interval lasting 4 beats with a max rate of 200 bpm, the longest lasting 18 beats with an avg rate of 125 bpm.  3) Isolated SVEswere occasional (2.3%, 37628), SVE Couplets were rare (<1.0%, 408), and SVETriplets were rare (<1.0%, 21).   4) Isolated VEs  were rare (<1.0%), and no VE Couplets or VE Triplets were present. Ventricular Bigeminy was present.   5) No symptoms with triggered events  Cardiac MRI 12/06/18 IMPRESSION: 1. Normal left ventricular size and function with LV ejection fraction calculated at 71%. There are no regional wall motion abnormalities. 2. Normal right ventricular size and function with RV ejection fraction calculated at 65%. 3. There is severe hypertrophy of the basal anteroseptal wall which measures up to 17 mm. There is systolic anterior motion (SAM) of the mitral valve seen.  4. There is  no evidence of abnormal delayed enhancement in the left ventricle to suggest fibrous changes / scarring. However some of the basal slices are missing from the delayed enhancement series.  Genetic Testing 01/24/19 The results are NEGATIVE.  Patient Profile  Alyssa Nguyen is a 79 y.o. female with hypertrophic CM (suspect basal septum variant) who was admitted 09/26/2019 for Afib with RVR and possible respiratory infection.   Assessment & Plan   1.  New onset atrial fibrillation with RVR -Converted back to normal sinus rhythm.  Apparently there is respiratory irritants in the house possibly.  Her acute condition could have all been related to A. fib with RVR. -Given her hypertrophic dermopathy, I think it is best to pursue rhythm control.  We will start with amiodarone IV for 24 hours and then transition to p.o. load.  She does have a cardiologist in [place] who she can follow with after this.  Should they want to consider other agents that can be decided then. -She is currently not a candidate for anticoagulation given her platelet count of 25.  In the setting of hypertrophic dermopathy, this would be indicated however we would not want to expose her to any necessary bleeding risk. -She will also continue her metoprolol succinate 50 mg daily -She did have pulmonary edema on her chest x-ray.  This is likely due to rapid A. fib and  mitral vegetation.  I would like to give her a bit of Lasix but her potassium is low.  I will reach out to the hospital medicine team to repeat a BMP this afternoon.  Like we will give her a little bit of Lasix after potassium is normal.  2.  Elevated troponin -Demand in the setting of hypertrophic dermopathy.  ST changes have resolved.  Echocardiogram pending.  I discussed with the son that given her platelet count really looking any further in her coronaries is not an option.  She is not a candidate for any invasive options anyway.  We will just plan for conservative management.  No aspirin, no heparin.  3.  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  This is a very benign variation of the disorder that does not result in sudden cardiac death especially at her age. -Norva Pavlov will be reduction in LVOT outflow tract obstruction.  We will continue her home metoprolol for now. -I highly suspect a respiratory illness led to all this.  Her platelet count being a bit lower also leads to possible infection.  I will defer to the medicine team to work-up her thrombocytopenia and to treat any potential infection.  For questions or updates, please contact [facility] Please consult [email or web address] for contact info under     Signed, Gerri Spore T. Flora Lipps, MD [facility]  [facility]  09/27/2019 9:08 AM

## 2019-09-27 NOTE — Progress Notes (Signed)
+   rhinovirus from resp. panel, infection prevention made aware and claimed there is no need to do isolation.

## 2019-09-27 NOTE — Progress Notes (Signed)
  Echocardiogram 2D Echocardiogram has been performed.  Alyssa Nguyen 09/27/2019, 12:23 PM

## 2019-09-27 NOTE — Consult Note (Signed)
New Hematology/Oncology Consult   Requesting MD: Dr. Horris Latino      Reason for Consult: Thrombocytopenia  HPI: [name] was admitted yesterday with dyspnea and tachycardia.  She reports "cold "symptoms.  She was noted to be in rapid atrial fibrillation.  Cardioversion was unsuccessful in the emergency room.  She was admitted for further evaluation.  A chest x-ray showed mild interstitial edema.  A CBC was remarkable for hemoglobin of 13.1, platelets 30,000, white count 9.8.  The absolute neutrophil count on a white cell differential today returned at 7.7.  She lives in [place].  She has been followed by hematology there for chronic ITP.  She also has a history of iron deficiency anemia treated with IV iron in January 2020.  She had a negative upper and lower endoscopy then.  The ITP has been followed with observation.  The plan is to initiate pulsed Decadron therapy for a platelet count less than 30,000.  A CBC on 07/06/2019 returned with a hemoglobin of 13.7, WBC 5.7, platelets 39,000, ANC 3.4.    Past medical history:  1.  History of iron deficiency anemia, negative GI evaluation January 2020 2.  ITP 3.  Hypertrophic cardiomyopathy 4.  Asthma 5.  Hypothyroidism   past surgical history: None   Current Facility-Administered Medications:  .  0.9 %  sodium chloride infusion, 250 mL, Intravenous, PRN, Opyd, Ilene Qua, MD .  acetaminophen (TYLENOL) tablet 650 mg, 650 mg, Oral, Q6H PRN **OR** acetaminophen (TYLENOL) suppository 650 mg, 650 mg, Rectal, Q6H PRN, Opyd, Ilene Qua, MD .  [EXPIRED] amiodarone (NEXTERONE PREMIX) 360-4.14 MG/200ML-% (1.8 mg/mL) IV infusion, 60 mg/hr, Intravenous, Continuous, Last Rate: 33.3 mL/hr at 09/27/19 1352, 60 mg/hr at 09/27/19 1352 **FOLLOWED BY** amiodarone (NEXTERONE PREMIX) 360-4.14 MG/200ML-% (1.8 mg/mL) IV infusion, 30 mg/hr, Intravenous, Continuous, O'Neal, Cassie Freer, MD .  dexamethasone (DECADRON) injection 40 mg, 40 mg, Intravenous, Q24H, Opyd,  Ilene Qua, MD, 40 mg at 09/27/19 0554 .  HYDROcodone-acetaminophen (NORCO/VICODIN) 5-325 MG per tablet 1 tablet, 1 tablet, Oral, Q6H PRN, Opyd, Ilene Qua, MD .  levalbuterol (XOPENEX) nebulizer solution 0.63 mg, 0.63 mg, Inhalation, Q8H PRN, Opyd, Timothy S, MD .  metoprolol succinate (TOPROL-XL) 24 hr tablet 50 mg, 50 mg, Oral, Daily, O'Neal, Cassie Freer, MD, 50 mg at 09/27/19 0952 .  ondansetron (ZOFRAN) tablet 4 mg, 4 mg, Oral, Q6H PRN **OR** ondansetron (ZOFRAN) injection 4 mg, 4 mg, Intravenous, Q6H PRN, Opyd, Timothy S, MD .  pantoprazole (PROTONIX) EC tablet 40 mg, 40 mg, Oral, Daily, Opyd, Ilene Qua, MD, 40 mg at 09/27/19 0952 .  sodium chloride flush (NS) 0.9 % injection 3 mL, 3 mL, Intravenous, Q12H, Opyd, Ilene Qua, MD, 3 mL at 09/27/19 0146 .  sodium chloride flush (NS) 0.9 % injection 3 mL, 3 mL, Intravenous, Q12H, Opyd, Ilene Qua, MD, 3 mL at 09/27/19 0953 .  sodium chloride flush (NS) 0.9 % injection 3 mL, 3 mL, Intravenous, PRN, Opyd, Ilene Qua, MD:  . dexamethasone (DECADRON) injection  40 mg Intravenous Q24H  . metoprolol succinate  50 mg Oral Daily  . pantoprazole  40 mg Oral Daily  . sodium chloride flush  3 mL Intravenous Q12H  . sodium chloride flush  3 mL Intravenous Q12H  :  Allergies  Allergen Reactions  . Diltiazem Swelling and Rash  :  FH: Noncontributory  SOCIAL HISTORY: She lives with a son in [place].  She is currently visiting her son in [place].  She does not smoke or use alcohol.  Review of Systems:   Positives include: Cough, tachycardia  A complete ROS was otherwise negative.   Physical Exam:  Blood pressure 91/75, pulse 63, temperature 98.1 F (36.7 C), temperature source Oral, resp. rate (!) 21, height 4\' 11"  (1.499 m), weight 93 lb 11.1 oz (42.5 kg), SpO2 99 %.  Lungs: Bilateral inspiratory/expiratory rhonchi and wheezes, no respiratory distress Cardiac: Regular rate and rhythm Abdomen: No hepatosplenomegaly  Vascular: No leg  edema Lymph nodes: No cervical, supraclavicular, axillary, or inguinal nodes Neurologic: Alert and oriented, the motor exam appears intact in the upper and lower extremities bilaterally Skin: No rash, no ecchymoses or petechiae Musculoskeletal: No spine tenderness  LABS:  Recent Labs    09/26/19 2239 09/27/19 0139  WBC 9.8 10.1  HGB 13.1 12.3  HCT 40.8 38.7  PLT 30* 25*  Blood smear from 09/27/2019: The platelets are decreased in number with many large platelets, no platelet clumps.  Few hypolobated neutrophils, the white cell morphology is otherwise unremarkable.  No blasts.  Numerous burr cells, few ovalocytes, rare teardrop and schistocyte  Recent Labs    09/27/19 0139 09/27/19 1439  NA 132* 131*  K 3.0* 4.4  CL 99 106  CO2 22 19*  GLUCOSE 170* 355*  BUN 10 12  CREATININE 0.63 0.63  CALCIUM 8.3* 9.0      RADIOLOGY:  DG Chest Port 1 View  Result Date: 09/27/2019 CLINICAL DATA:  Pulmonary edema EXAM: PORTABLE CHEST 1 VIEW COMPARISON:  09/26/2019 FINDINGS: External pacer paddles are noted. The cardiac silhouette, mediastinal and hilar contours are within normal limits and stable. The lungs show improved aeration with resolving edema and atelectasis. No definite pleural effusions. No pneumothorax. IMPRESSION: Improving lung aeration with resolving edema and atelectasis. Electronically Signed   By: 09/28/2019 M.D.   On: 09/27/2019 11:00   DG Chest Portable 1 View  Result Date: 09/26/2019 CLINICAL DATA:  Shortness of breath EXAM: PORTABLE CHEST 1 VIEW COMPARISON:  None. FINDINGS: Cardiac shadow is enlarged. Aortic calcifications are noted. Mild interstitial edema is seen. No focal confluent infiltrate is noted. No bony abnormality is seen. IMPRESSION: Mild interstitial edema. Electronically Signed   By: 09/28/2019 M.D.   On: 09/26/2019 23:26   ECHOCARDIOGRAM COMPLETE  Result Date: 09/27/2019    ECHOCARDIOGRAM REPORT   Patient Name:   Alyssa Nguyen Date of Exam: 09/27/2019  Medical Rec #:  11/27/2019     Height:       59.0 in Accession #:    [id number]    Weight:       93.7 lb Date of Birth:  06-25-1941      BSA:          1.336 m Patient Age:    78 years      BP:           140/97 mmHg Patient Gender: F             HR:           63 bpm. Exam Location:  Inpatient Procedure: 3D Echo, 2D Echo, Cardiac Doppler and Color Doppler Indications:    R94.31 Abnormal EKG  History:        Patient has no prior history of Echocardiogram examinations.                 HOCM, Abnormal ECG, COPD; Arrythmias:Atrial Fibrillation.  Sonographer:    02/04/1941 RDCS Referring Phys: Sheralyn [facility] MARTIN  Sonographer Comments: Image acquisition challenging due to  respiratory motion and Image acquisition challenging due to COPD. IMPRESSIONS  1. Vigorous LV systolic function; moderate LVH with proximal septal thickening; grade 2 diastolic dysfunction; chordal SAM with elevated LVOT gradient of 4 m/S (findings c/w HOCM physiology); trace AI; moderate MR; mild LAE; mild TR.  2. Left ventricular ejection fraction, by estimation, is >75%. The left ventricle has hyperdynamic function. The left ventricle has no regional wall motion abnormalities. There is moderate left ventricular hypertrophy. Left ventricular diastolic parameters are consistent with Grade II diastolic dysfunction (pseudonormalization). Elevated left atrial pressure.  3. Right ventricular systolic function is normal. The right ventricular size is normal. There is mildly elevated pulmonary artery systolic pressure.  4. Left atrial size was mildly dilated.  5. The mitral valve is normal in structure. Moderate mitral valve regurgitation. No evidence of mitral stenosis.  6. The aortic valve is tricuspid. Aortic valve regurgitation is trivial. Mild aortic valve sclerosis is present, with no evidence of aortic valve stenosis.  7. The inferior vena cava is normal in size with greater than 50% respiratory variability, suggesting right atrial pressure of 3  mmHg. FINDINGS  Left Ventricle: Left ventricular ejection fraction, by estimation, is >75%. The left ventricle has hyperdynamic function. The left ventricle has no regional wall motion abnormalities. The left ventricular internal cavity size was normal in size. There is moderate left ventricular hypertrophy. Left ventricular diastolic parameters are consistent with Grade II diastolic dysfunction (pseudonormalization). Elevated left atrial pressure. Right Ventricle: The right ventricular size is normal.Right ventricular systolic function is normal. There is mildly elevated pulmonary artery systolic pressure. The tricuspid regurgitant velocity is 2.85 m/s, and with an assumed right atrial pressure of  3 mmHg, the estimated right ventricular systolic pressure is 35.5 mmHg. Left Atrium: Left atrial size was mildly dilated. Right Atrium: Right atrial size was normal in size. Pericardium: There is no evidence of pericardial effusion. Mitral Valve: The mitral valve is normal in structure. Normal mobility of the mitral valve leaflets. Moderate mitral valve regurgitation. No evidence of mitral valve stenosis. Tricuspid Valve: The tricuspid valve is normal in structure. Tricuspid valve regurgitation is mild . No evidence of tricuspid stenosis. Aortic Valve: The aortic valve is tricuspid. Aortic valve regurgitation is trivial. Mild aortic valve sclerosis is present, with no evidence of aortic valve stenosis. Aortic valve mean gradient measures 9.0 mmHg. Aortic valve peak gradient measures 17.6 mmHg. Aortic valve area, by VTI measures 2.18 cm. Pulmonic Valve: The pulmonic valve was normal in structure. Pulmonic valve regurgitation is trivial. No evidence of pulmonic stenosis. Aorta: The aortic root is normal in size and structure. Venous: The inferior vena cava is normal in size with greater than 50% respiratory variability, suggesting right atrial pressure of 3 mmHg. IAS/Shunts: No atrial level shunt detected by color flow  Doppler. Additional Comments: Vigorous LV systolic function; moderate LVH with proximal septal thickening; grade 2 diastolic dysfunction; chordal SAM with elevated LVOT gradient of 4 m/S (findings c/w HOCM physiology); trace AI; moderate MR; mild LAE; mild TR.  LEFT VENTRICLE PLAX 2D LVIDd:         2.87 cm     Diastology LVIDs:         1.39 cm     LV e' lateral:   6.20 cm/s LV PW:         1.53 cm     LV E/e' lateral: 16.1 LV IVS:        1.60 cm     LV e' medial:    4.57  cm/s LVOT diam:     1.80 cm     LV E/e' medial:  21.8 LV SV:         100 LV SV Index:   75 LVOT Area:     2.54 cm  LV Volumes (MOD) LV vol d, MOD A2C: 67.6 ml LV vol d, MOD A4C: 49.7 ml LV vol s, MOD A2C: 11.0 ml LV vol s, MOD A4C: 17.5 ml LV SV MOD A2C:     56.6 ml LV SV MOD A4C:     49.7 ml LV SV MOD BP:      44.5 ml RIGHT VENTRICLE             IVC RV S prime:     12.30 cm/s  IVC diam: 1.82 cm TAPSE (M-mode): 2.9 cm LEFT ATRIUM           Index       RIGHT ATRIUM           Index LA diam:      2.80 cm 2.10 cm/m  RA Area:     15.30 cm LA Vol (A2C): 47.9 ml 35.85 ml/m RA Volume:   40.70 ml  30.46 ml/m LA Vol (A4C): 20.2 ml 15.12 ml/m  AORTIC VALVE                    PULMONIC VALVE AV Area (Vmax):    2.28 cm     PR End Diast Vel: 2.08 msec AV Area (Vmean):   2.45 cm AV Area (VTI):     2.18 cm AV Vmax:           210.00 cm/s AV Vmean:          135.000 cm/s AV VTI:            0.459 m AV Peak Grad:      17.6 mmHg AV Mean Grad:      9.0 mmHg LVOT Vmax:         188.50 cm/s LVOT Vmean:        130.000 cm/s LVOT VTI:          0.394 m LVOT/AV VTI ratio: 0.86  AORTA Ao Root diam: 3.00 cm MITRAL VALVE                 TRICUSPID VALVE MV Area (PHT): 3.72 cm      TR Peak grad:   32.5 mmHg MV Decel Time: 204 msec      TR Vmax:        285.00 cm/s MR Peak grad:    125.9 mmHg MR Mean grad:    77.0 mmHg   SHUNTS MR Vmax:         561.00 cm/s Systemic VTI:  0.39 m MR Vmean:        412.0 cm/s  Systemic Diam: 1.80 cm MR PISA:         0.57 cm MR PISA Eff ROA: 3 mm  MR PISA Radius:  0.30 cm MV E velocity: 99.80 cm/s MV A velocity: 94.90 cm/s MV E/A ratio:  1.05 Alyssa Millers MD Electronically signed by Alyssa Millers MD Signature Date/Time: 09/27/2019/2:32:39 PM    Final     Assessment and Plan:   1.  Thrombocytopenia, history of ITP 2.   History of iron deficiency anemia 3.   New onset atrial fibrillation 4.   Hypertrophic cardiomyopathy 5.   Asthma 6.   Hypothyroidism 7.   Upper respiratory infection?  Respiratory panel  positive for rhinovirus  [name] is admitted with new onset atrial fibrillation in the setting of a probable upper respiratory infection.  She has severe thrombocytopenia.  She has a history of ITP.  I agree with treating the ITP as there may be an indication for anticoagulation therapy with the atrial fibrillation.  I would decrease the dose of Decadron to 20 mg in this elderly patient.  The blood smear findings could be indicative of recurrent iron deficiency.  I will add iron studies to the admission labs.  She could also have myelodysplasia.  Recommendation: 1.  Continue pulse Decadron, 20 mg daily for 4 days 2.  Check ferritin and serum iron studies 3.  Outpatient follow-up in the hematology clinic next 2 weeks        Alyssa Nguyen Patient, MD 09/27/2019, 3:30 PM

## 2019-09-28 ENCOUNTER — Encounter: Payer: Self-pay | Admitting: *Deleted

## 2019-09-28 ENCOUNTER — Other Ambulatory Visit: Payer: Self-pay | Admitting: *Deleted

## 2019-09-28 DIAGNOSIS — D696 Thrombocytopenia, unspecified: Secondary | ICD-10-CM

## 2019-09-28 LAB — COMPREHENSIVE METABOLIC PANEL
ALT: 31 U/L (ref 0–44)
AST: 41 U/L (ref 15–41)
Albumin: 2.8 g/dL — ABNORMAL LOW (ref 3.5–5.0)
Alkaline Phosphatase: 43 U/L (ref 38–126)
Anion gap: 6 (ref 5–15)
BUN: 9 mg/dL (ref 8–23)
CO2: 24 mmol/L (ref 22–32)
Calcium: 9.4 mg/dL (ref 8.9–10.3)
Chloride: 107 mmol/L (ref 98–111)
Creatinine, Ser: 0.58 mg/dL (ref 0.44–1.00)
GFR calc Af Amer: >60 mL/min (ref >60)
GFR calc non Af Amer: >60 mL/min (ref >60)
Glucose, Bld: 162 mg/dL — ABNORMAL HIGH (ref 70–99)
Potassium: 4.9 mmol/L (ref 3.5–5.1)
Sodium: 137 mmol/L (ref 135–145)
Total Bilirubin: <0.1 mg/dL — ABNORMAL LOW (ref 0.3–1.2)
Total Protein: 5.9 g/dL — ABNORMAL LOW (ref 6.5–8.1)

## 2019-09-28 LAB — URINE CULTURE: Culture: <10000 — AB

## 2019-09-28 LAB — CBC WITH DIFFERENTIAL/PLATELET
Abs Immature Granulocytes: 0.04 10*3/uL (ref 0.00–0.07)
Basophils Absolute: 0 10*3/uL (ref 0.0–0.1)
Basophils Relative: 0 %
Eosinophils Absolute: 0 10*3/uL (ref 0.0–0.5)
Eosinophils Relative: 0 %
HCT: 35.7 % — ABNORMAL LOW (ref 36.0–46.0)
Hemoglobin: 11.5 g/dL — ABNORMAL LOW (ref 12.0–15.0)
Immature Granulocytes: 1 %
Lymphocytes Relative: 15 %
Lymphs Abs: 1.2 10*3/uL (ref 0.7–4.0)
MCH: 28.6 pg (ref 26.0–34.0)
MCHC: 32.2 g/dL (ref 30.0–36.0)
MCV: 88.8 fL (ref 80.0–100.0)
Monocytes Absolute: 0.4 10*3/uL (ref 0.1–1.0)
Monocytes Relative: 5 %
Neutro Abs: 6.2 10*3/uL (ref 1.7–7.7)
Neutrophils Relative %: 79 %
Platelets: 60 10*3/uL — ABNORMAL LOW (ref 150–400)
RBC: 4.02 MIL/uL (ref 3.87–5.11)
RDW: 12.7 % (ref 11.5–15.5)
WBC: 7.8 10*3/uL (ref 4.0–10.5)
nRBC: 0 % (ref 0.0–0.2)

## 2019-09-28 LAB — GLUCOSE, CAPILLARY
Glucose-Capillary: 160 mg/dL — ABNORMAL HIGH (ref 70–99)
Glucose-Capillary: 102 mg/dL — ABNORMAL HIGH (ref 70–99)

## 2019-09-28 LAB — HEMOGLOBIN A1C
Hgb A1c MFr Bld: 6 % — ABNORMAL HIGH (ref 4.8–5.6)
Mean Plasma Glucose: 125.5 mg/dL

## 2019-09-28 MED ORDER — MOMETASONE FURO-FORMOTEROL FUM 200-5 MCG/ACT IN AERO
2.0000 | INHALATION_SPRAY | Freq: Two times a day (BID) | RESPIRATORY_TRACT | Status: DC
  Filled 2019-09-28: qty 8.8

## 2019-09-28 MED ORDER — LEVOTHYROXINE SODIUM 25 MCG PO TABS
25.0000 ug | ORAL_TABLET | Freq: Every day | ORAL | Status: DC
  Administered 2019-09-28: 25 ug via ORAL
  Filled 2019-09-28: qty 1

## 2019-09-28 MED ORDER — AMIODARONE HCL 200 MG PO TABS: 200.0000 mg | ORAL_TABLET | Freq: Every day | ORAL | 0 refills | Status: AC

## 2019-09-28 MED ORDER — AMIODARONE HCL 200 MG PO TABS: 200.0000 mg | ORAL_TABLET | Freq: Two times a day (BID) | ORAL | 0 refills | Status: DC

## 2019-09-28 MED ORDER — LEVALBUTEROL HCL 0.63 MG/3ML IN NEBU: 0.6300 mg | INHALATION_SOLUTION | Freq: Three times a day (TID) | RESPIRATORY_TRACT | 0 refills | Status: DC

## 2019-09-28 MED ORDER — MUCINEX 600 MG PO TB12: 600.0000 mg | ORAL_TABLET | Freq: Two times a day (BID) | ORAL | 0 refills | Status: AC

## 2019-09-28 MED ORDER — AMIODARONE HCL 200 MG PO TABS: 200.0000 mg | ORAL_TABLET | Freq: Every day | ORAL | Status: DC

## 2019-09-28 MED ORDER — PANTOPRAZOLE SODIUM 40 MG PO TBEC: 40.0000 mg | DELAYED_RELEASE_TABLET | Freq: Every day | ORAL | 0 refills | Status: DC

## 2019-09-28 MED ORDER — AMIODARONE HCL 200 MG PO TABS
200.0000 mg | ORAL_TABLET | Freq: Two times a day (BID) | ORAL | Status: DC
  Administered 2019-09-28: 200 mg via ORAL
  Filled 2019-09-28: qty 1

## 2019-09-28 MED ORDER — DEXAMETHASONE 20 MG PO TABS: 20.0000 mg | ORAL_TABLET | Freq: Every day | ORAL | 0 refills | Status: AC

## 2019-09-28 MED ORDER — GUAIFENESIN 100 MG/5ML PO SOLN
5.0000 mL | ORAL | Status: DC | PRN
  Administered 2019-09-28: 100 mg via ORAL
  Filled 2019-09-28: qty 25

## 2019-09-28 MED ORDER — METOPROLOL SUCCINATE ER 50 MG PO TB24: 25.0000 mg | ORAL_TABLET | Freq: Every day | ORAL | 0 refills | Status: AC

## 2019-09-28 MED ORDER — LEVALBUTEROL HCL 0.63 MG/3ML IN NEBU
0.6300 mg | INHALATION_SOLUTION | Freq: Four times a day (QID) | RESPIRATORY_TRACT | Status: DC
  Administered 2019-09-28 (×2): 0.63 mg via RESPIRATORY_TRACT
  Filled 2019-09-28 (×2): qty 3

## 2019-09-28 MED ORDER — METOPROLOL SUCCINATE ER 25 MG PO TB24
25.0000 mg | ORAL_TABLET | Freq: Every day | ORAL | Status: DC
  Administered 2019-09-28: 25 mg via ORAL
  Filled 2019-09-28: qty 1

## 2019-09-28 NOTE — Evaluation (Signed)
Physical Therapy Evaluation Patient Details Name: Alyssa Nguyen MRN: [id number] DOB: January 25, 1941 Today's Date: 09/28/2019   History of Present Illness  Patient is a 79 y/o female who presents with SOB and bil shoulder pain. EKG-A-fib with RVR. Also noted to have thrombocytopenua. No PMH on file as pt from [place].  Clinical Impression  Patient presents with decreased activity tolerance, dyspnea on exertion and impaired balance s/p above. Pt visiting family from [place]. Pt independent with ADLs/IADLs and walking PTA. Family helps with driving. Reports no falls. Today, pt tolerated gait training with close min guard for safety. 1 instance of staggering when removing mask into door but no overt LOB. Noted to have 2/4 DOE. Sp02 remained >93% on RA, but dropped to 86% post walk with talking. Education re: pursed lip breathing, importance of using SPC until endurance/balance improve etc Encouraged walking with nursing a few times daily. Plans to stay in [place] with son (who is an MD) for another week. Will follow acutely to maximize independence and mobility prior to return home.    Follow Up Recommendations No PT follow up;Supervision for mobility/OOB    Equipment Recommendations  None recommended by PT    Recommendations for Other Services       Precautions / Restrictions Precautions Precautions: Fall;Other (comment) Precaution Comments: watch 02 Restrictions Weight Bearing Restrictions: No      Mobility  Bed Mobility               General bed mobility comments: Sitting EOB upon PT arrival.  Transfers Overall transfer level: Needs assistance Equipment used: None Transfers: Sit to/from Stand Sit to Stand: Min guard         General transfer comment: Min guard for safety. Stood from [employer].  Ambulation/Gait Ambulation/Gait assistance: Min guard Gait Distance (Feet): 120 Feet Assistive device: None Gait Pattern/deviations: Step-through pattern;Decreased stride  length;Narrow base of support;Staggering right Gait velocity: decreased   General Gait Details: Slow, mostly steady gait with 1 instance of staggering to the right when removing mask upon entering room; no overt LOB. Pt has [place] she can use but preferred not to today. Sp02 >93% on RA with gait but dropped to 86% post walk.  Stairs            Wheelchair Mobility    Modified Rankin (Stroke Patients Only)       Balance Overall balance assessment: Needs assistance Sitting-balance support: Feet supported;No upper extremity supported Sitting balance-Leahy Scale: Good     Standing balance support: During functional activity Standing balance-Leahy Scale: Fair Standing balance comment: Difficulty with dynamic balance, close min guard.                             Pertinent Vitals/Pain Pain Assessment: No/denies pain    Home Living Family/patient expects to be discharged to:: Private residence Living Arrangements: Children(with son) Available Help at Discharge: Family Type of Home: House Home Access: Stairs to enter Entrance Stairs-Rails: Right Entrance Stairs-Number of Steps: 6-7 Home Layout: Two level;Able to live on main level with bedroom/bathroom Home Equipment: Kasandra Knudsen - single point Additional Comments: This describes son's house, but pt is from [place].    Prior Function Level of Independence: Independent with assistive device(s)         Comments: Does her own ADLs, IADLs. Does not drive. Family helps with driving. Walks 2 miles daily.     Hand Dominance        Extremity/Trunk Assessment  Upper Extremity Assessment Upper Extremity Assessment: Defer to OT evaluation    Lower Extremity Assessment Lower Extremity Assessment: Overall WFL for tasks assessed    Cervical / Trunk Assessment Cervical / Trunk Assessment: Normal  Communication   Communication: No difficulties  Cognition Arousal/Alertness: Awake/alert Behavior During Therapy: WFL for  tasks assessed/performed Overall Cognitive Status: Within Functional Limits for tasks assessed                                        General Comments General comments (skin integrity, edema, etc.): Sp02 >93% during mobility, dropped to 86% post walk when talking.    Exercises     Assessment/Plan    PT Assessment Patient needs continued PT services  PT Problem List Decreased mobility;Cardiopulmonary status limiting activity;Decreased activity tolerance;Decreased balance       PT Treatment Interventions DME instruction;Therapeutic activities;Gait training;Therapeutic exercise;Stair training;Balance training;Functional mobility training;Patient/family education    PT Goals (Current goals can be found in the Care Plan section)  Acute Rehab PT Goals Patient Stated Goal: to go home PT Goal Formulation: With patient Time For Goal Achievement: 10/12/19 Potential to Achieve Goals: Good    Frequency Min 3X/week   Barriers to discharge Inaccessible home environment stairs    Co-evaluation               AM-PAC PT "6 Clicks" Mobility  Outcome Measure Help needed turning from your back to your side while in a flat bed without using bedrails?: None Help needed moving from lying on your back to sitting on the side of a flat bed without using bedrails?: None Help needed moving to and from a bed to a chair (including a wheelchair)?: A Little Help needed standing up from a chair using your arms (e.g., wheelchair or bedside chair)?: A Little Help needed to walk in hospital room?: A Little Help needed climbing 3-5 steps with a railing? : A Little 6 Click Score: 20    End of Session Equipment Utilized During Treatment: Gait belt Activity Tolerance: Patient tolerated treatment well Patient left: in bed;with call bell/phone within reach(sitting EOB) Nurse Communication: Mobility status PT Visit Diagnosis: Difficulty in walking, not elsewhere classified  (R26.2);Unsteadiness on feet (R26.81)    Time: 1330-1350 PT Time Calculation (min) (ACUTE ONLY): 20 min   Charges:   PT Evaluation $PT Eval Moderate Complexity: 1 Mod          [name], PT, DPT Acute Rehabilitation Services Pager [phone] Office [phone]      [name] 09/28/2019, 3:03 PM

## 2019-09-28 NOTE — Progress Notes (Signed)
IP PROGRESS NOTE  Subjective:   She reports a cough.  No other complaint.  Objective: Vital signs in last 24 hours: Blood pressure 133/68, pulse (!) 56, temperature 97.8 F (36.6 C), temperature source Oral, resp. rate 19, height 4\' 11"  (1.499 m), weight 92 lb 9.5 oz (42 kg), SpO2 100 %.  Intake/Output from previous day: 04/01 0701 - 04/02 0700 In: 795.8 [P.O.:340; I.V.:455.8] Out: 950 [Urine:950]  Physical Exam: Not performed today  Lab Results: Recent Labs    09/27/19 0139 09/28/19 0218  WBC 10.1 7.8  HGB 12.3 11.5*  HCT 38.7 35.7*  PLT 25* 60*    BMET Recent Labs    09/27/19 1439 09/28/19 0218  NA 131* 137  K 4.4 4.9  CL 106 107  CO2 19* 24  GLUCOSE 355* 162*  BUN 12 9  CREATININE 0.63 0.58  CALCIUM 9.0 9.4   09/27/2019: Ferritin 216, iron 23, TIBC 238  Studies/Results: DG Chest Port 1 View  Result Date: 09/27/2019 CLINICAL DATA:  Pulmonary edema EXAM: PORTABLE CHEST 1 VIEW COMPARISON:  09/26/2019 FINDINGS: External pacer paddles are noted. The cardiac silhouette, mediastinal and hilar contours are within normal limits and stable. The lungs show improved aeration with resolving edema and atelectasis. No definite pleural effusions. No pneumothorax. IMPRESSION: Improving lung aeration with resolving edema and atelectasis. Electronically Signed   By: Marijo Sanes M.D.   On: 09/27/2019 11:00   DG Chest Portable 1 View  Result Date: 09/26/2019 CLINICAL DATA:  Shortness of breath EXAM: PORTABLE CHEST 1 VIEW COMPARISON:  None. FINDINGS: Cardiac shadow is enlarged. Aortic calcifications are noted. Mild interstitial edema is seen. No focal confluent infiltrate is noted. No bony abnormality is seen. IMPRESSION: Mild interstitial edema. Electronically Signed   By: Inez Catalina M.D.   On: 09/26/2019 23:26   ECHOCARDIOGRAM COMPLETE  Result Date: 09/27/2019    ECHOCARDIOGRAM REPORT   Patient Name:   Alyssa Nguyen Date of Exam: 09/27/2019 Medical Rec #:  [id number]     Height:        59.0 in Accession #:    [id number]    Weight:       93.7 lb Date of Birth:  1941-03-26      BSA:          1.336 m Patient Age:    79 years      BP:           140/97 mmHg Patient Gender: F             HR:           63 bpm. Exam Location:  Inpatient Procedure: 3D Echo, 2D Echo, Cardiac Doppler and Color Doppler Indications:    R94.31 Abnormal EKG  History:        Patient has no prior history of Echocardiogram examinations.                 HOCM, Abnormal ECG, COPD; Arrythmias:Atrial Fibrillation.  Sonographer:    Roseanna Rainbow RDCS Referring Phys: [id number] [facility] MARTIN  Sonographer Comments: Image acquisition challenging due to respiratory motion and Image acquisition challenging due to COPD. IMPRESSIONS  1. Vigorous LV systolic function; moderate LVH with proximal septal thickening; grade 2 diastolic dysfunction; chordal SAM with elevated LVOT gradient of 4 m/S (findings c/w HOCM physiology); trace AI; moderate MR; mild LAE; mild TR.  2. Left ventricular ejection fraction, by estimation, is >75%. The left ventricle has hyperdynamic function. The left ventricle has no regional wall  motion abnormalities. There is moderate left ventricular hypertrophy. Left ventricular diastolic parameters are consistent with Grade II diastolic dysfunction (pseudonormalization). Elevated left atrial pressure.  3. Right ventricular systolic function is normal. The right ventricular size is normal. There is mildly elevated pulmonary artery systolic pressure.  4. Left atrial size was mildly dilated.  5. The mitral valve is normal in structure. Moderate mitral valve regurgitation. No evidence of mitral stenosis.  6. The aortic valve is tricuspid. Aortic valve regurgitation is trivial. Mild aortic valve sclerosis is present, with no evidence of aortic valve stenosis.  7. The inferior vena cava is normal in size with greater than 50% respiratory variability, suggesting right atrial pressure of 3 mmHg. FINDINGS  Left Ventricle: Left  ventricular ejection fraction, by estimation, is >75%. The left ventricle has hyperdynamic function. The left ventricle has no regional wall motion abnormalities. The left ventricular internal cavity size was normal in size. There is moderate left ventricular hypertrophy. Left ventricular diastolic parameters are consistent with Grade II diastolic dysfunction (pseudonormalization). Elevated left atrial pressure. Right Ventricle: The right ventricular size is normal.Right ventricular systolic function is normal. There is mildly elevated pulmonary artery systolic pressure. The tricuspid regurgitant velocity is 2.85 m/s, and with an assumed right atrial pressure of  3 mmHg, the estimated right ventricular systolic pressure is 35.5 mmHg. Left Atrium: Left atrial size was mildly dilated. Right Atrium: Right atrial size was normal in size. Pericardium: There is no evidence of pericardial effusion. Mitral Valve: The mitral valve is normal in structure. Normal mobility of the mitral valve leaflets. Moderate mitral valve regurgitation. No evidence of mitral valve stenosis. Tricuspid Valve: The tricuspid valve is normal in structure. Tricuspid valve regurgitation is mild . No evidence of tricuspid stenosis. Aortic Valve: The aortic valve is tricuspid. Aortic valve regurgitation is trivial. Mild aortic valve sclerosis is present, with no evidence of aortic valve stenosis. Aortic valve mean gradient measures 9.0 mmHg. Aortic valve peak gradient measures 17.6 mmHg. Aortic valve area, by VTI measures 2.18 cm. Pulmonic Valve: The pulmonic valve was normal in structure. Pulmonic valve regurgitation is trivial. No evidence of pulmonic stenosis. Aorta: The aortic root is normal in size and structure. Venous: The inferior vena cava is normal in size with greater than 50% respiratory variability, suggesting right atrial pressure of 3 mmHg. IAS/Shunts: No atrial level shunt detected by color flow Doppler. Additional Comments: Vigorous  LV systolic function; moderate LVH with proximal septal thickening; grade 2 diastolic dysfunction; chordal SAM with elevated LVOT gradient of 4 m/S (findings c/w HOCM physiology); trace AI; moderate MR; mild LAE; mild TR.  LEFT VENTRICLE PLAX 2D LVIDd:         2.87 cm     Diastology LVIDs:         1.39 cm     LV e' lateral:   6.20 cm/s LV PW:         1.53 cm     LV E/e' lateral: 16.1 LV IVS:        1.60 cm     LV e' medial:    4.57 cm/s LVOT diam:     1.80 cm     LV E/e' medial:  21.8 LV SV:         100 LV SV Index:   75 LVOT Area:     2.54 cm  LV Volumes (MOD) LV vol d, MOD A2C: 67.6 ml LV vol d, MOD A4C: 49.7 ml LV vol s, MOD A2C: 11.0 ml LV vol  s, MOD A4C: 17.5 ml LV SV MOD A2C:     56.6 ml LV SV MOD A4C:     49.7 ml LV SV MOD BP:      44.5 ml RIGHT VENTRICLE             IVC RV S prime:     12.30 cm/s  IVC diam: 1.82 cm TAPSE (M-mode): 2.9 cm LEFT ATRIUM           Index       RIGHT ATRIUM           Index LA diam:      2.80 cm 2.10 cm/m  RA Area:     15.30 cm LA Vol (A2C): 47.9 ml 35.85 ml/m RA Volume:   40.70 ml  30.46 ml/m LA Vol (A4C): 20.2 ml 15.12 ml/m  AORTIC VALVE                    PULMONIC VALVE AV Area (Vmax):    2.28 cm     PR End Diast Vel: 2.08 msec AV Area (Vmean):   2.45 cm AV Area (VTI):     2.18 cm AV Vmax:           210.00 cm/s AV Vmean:          135.000 cm/s AV VTI:            0.459 m AV Peak Grad:      17.6 mmHg AV Mean Grad:      9.0 mmHg LVOT Vmax:         188.50 cm/s LVOT Vmean:        130.000 cm/s LVOT VTI:          0.394 m LVOT/AV VTI ratio: 0.86  AORTA Ao Root diam: 3.00 cm MITRAL VALVE                 TRICUSPID VALVE MV Area (PHT): 3.72 cm      TR Peak grad:   32.5 mmHg MV Decel Time: 204 msec      TR Vmax:        285.00 cm/s MR Peak grad:    125.9 mmHg MR Mean grad:    77.0 mmHg   SHUNTS MR Vmax:         561.00 cm/s Systemic VTI:  0.39 m MR Vmean:        412.0 cm/s  Systemic Diam: 1.80 cm MR PISA:         0.57 cm MR PISA Eff ROA: 3 mm MR PISA Radius:  0.30 cm MV E velocity:  99.80 cm/s MV A velocity: 94.90 cm/s MV E/A ratio:  1.05 Olga Millers MD Electronically signed by Olga Millers MD Signature Date/Time: 09/27/2019/2:32:39 PM    Final     Medications: I have reviewed the patient's current medications.  Assessment/Plan: 1.  Thrombocytopenia, history of ITP  Pulse Decadron starting 09/27/2019 2.   History of iron deficiency anemia 3.   New onset atrial fibrillation 4.   Hypertrophic cardiomyopathy 5.   Asthma 6.   Hypothyroidism 7.   Upper respiratory infection?  Respiratory panel positive for rhinovirus   The platelet count is higher today.  Hopefully there will be a durable platelet response to the Decadron.  The ferritin level returned normal.  The mild anemia is likely related to multiple blood draws, though she may have an underlying hematopoietic condition accounting for the blood smear findings.  Recommendations: 1.  Continue the  4-day course of pulse Decadron 2.  Hematologist and cardiologist in [place] can determine indication for anticoagulation therapy 3.  Please call hematology over the weekend as needed 4.  Outpatient follow-up will be arranged in the hematologic clinic for week of 10/08/2019 if she remains in [place]   LOS: 1 day   Thornton Papas, MD   09/28/2019, 2:43 PM

## 2019-09-28 NOTE — Progress Notes (Signed)
With bouts of non-productive cough, MD aware, robitussin given continue to monitor.

## 2019-09-28 NOTE — Progress Notes (Signed)
Discharged home accompanied by son.Discharged  Instructions given to pt/son. Belongings taken home.

## 2019-09-28 NOTE — Progress Notes (Signed)
SATURATION QUALIFICATIONS: (This note is used to comply with regulatory documentation for home oxygen)  Patient Saturations on Room Air at Rest = 100%  Patient Saturations on Room Air while Ambulating = 92%  Patient Saturations on 0 Liters of oxygen while Ambulating = 92%  Please briefly explain why patient needs home oxygen: 

## 2019-09-28 NOTE — TOC Transition Note (Addendum)
Transition of Care [facility]) - CM/SW Discharge Note   Patient Details  Name: Alyssa Nguyen MRN: [id number] Date of Birth: 10/20/1940  [facility]) CM/SW Contact:  Epifanio Lesches, RN Phone Number: [phone] 09/28/2019, 11:56 AM   Clinical Narrative:   Admitted with a.fib, thrombocytopenia, hx of hypothyroidism, emphysema, hypertrophic cardiomyopathy, and ITP. From [place] visiting son, Dr. Dan Europe [phone].PTA independent with ADL's, no DME usage.   PT /OT evaluations pending...  Per son once d/c, pt will reside with him until mid April : [address] [place] [address]  NCM offered choice to son/pt for [facility] services pending PT/OT evaluation. [facility] selected if [facility] services needed.  TOC team will continue to monitor for needs....   09/28/2019 Referral for nebulizer made with [employer] and will be delivered to beside prior to d/c    Final next level of care: Home w Home Health Services Barriers to Discharge: No Barriers Identified   Patient Goals and CMS Choice     Choice offered to / list presented to : Patient  Discharge Placement   Discharge Plan and Services     [facility] Agency: [facility] pending MD orders Date [facility] Agency Contacted: 09/28/19 Time HH Agency Contacted: [phone] Representative spoke with at [facility] Agency: Lupita Leash  Social Determinants of Health (SDOH) Interventions     Readmission Risk Interventions No flowsheet data found.

## 2019-09-28 NOTE — Discharge Summary (Signed)
Discharge Summary  Alyssa Nguyen ZOX:[id number] DOB: [birth year]  PCP: System, Provider Not In  Admit date: 09/26/2019 Discharge date: 09/28/2019  Time spent: 40 mins  Recommendations for Outpatient Follow-up:  1. Follow-up with PCP in 1 to 2 weeks 2. Follow-up with cardiology in 1 to 2 weeks   Discharge Diagnoses:  Active Hospital Problems   Diagnosis Date Noted  . Atrial fibrillation with RVR (HCC) 09/27/2019  . Thrombocytopenia (HCC) 09/27/2019  . Hypertrophic cardiomyopathy (HCC) 09/27/2019  . Hypokalemia 09/27/2019  . COPD (chronic obstructive pulmonary disease) (HCC) 09/27/2019  . Hypothyroidism 09/27/2019    Resolved Hospital Problems  No resolved problems to display.    Discharge Condition: Stable  Diet recommendation: Heart healthy/mod carb  Vitals:   09/28/19 0926 09/28/19 1121  BP:    Pulse:    Resp:    Temp:  97.8 F (36.6 C)  SpO2: 100% 100%    History of present illness:  Alyssa Nguyen a 79 y.o.femalewith medical history significant forhypothyroidism, emphysema, hypertrophic cardiomyopathy, and ITP, presents to the ED with SOB and bilateral shoulder pain. Patient is accompanied by her son, a local physician, who assists with the history. The patient recently traveled from [place] to visit family here, has had some mild dyspnea and malaise for a couple days, but then developed acute discomfort behind both shoulders tonight. Family used a pulse oximeter and noted that her heart rate was erratic and very fast at times,and brought her into the ED for evaluation of this. Patient denies any chest pain, fevers, or chills. She reports having some shortness of breath earlier, mild cough recently. She has not had any lower extremity swelling or tenderness. Upon arrival to the ED, patient is found to be afebrile, saturating well on room air, heart rate in the 150s, and blood pressure 90s systolic. EKG features atrial fibrillation with rate 153, nonspecific  IVCD, LVH, and repolarization abnormality. Chest x-ray with mild interstitial edema. Chemistry panel notable for potassium 3.3 and bicarbonate 19. CBC with platelets 30,000. High-sensitivity troponin was initially normal, repeat was about 2595.  Cardiology was consulted by the EDP. 2 unsuccessful attempts at cardioversion were made. Patient was treated with diltiazem in the ED.COVID-19 PCR is negative.  Patient admitted for further management.    Today, patient reported feeling fine, eager to be discharged.  Noted to be in mild respiratory distress early this morning with wheezing noted bilaterally.  Was able to ambulate with PT with oxygen saturations maintaining above 90%, noted bouts of nonproductive cough.  Discussed extensively with patient's son who is also an MD, about follow-up care.  Prefers for patient to follow-up with her PCP/cardiologist/hematologist in [place].  Patient will be discharged with a nebulizer machine.  Son knows to bring patient back to the ED, if worsening symptoms.   Hospital Course:  Principal Problem:   Atrial fibrillation with RVR (HCC) Active Problems:   Thrombocytopenia (HCC)   Hypertrophic cardiomyopathy (HCC)   Hypokalemia   COPD (chronic obstructive pulmonary disease) (HCC)   Hypothyroidism   Acute rhinovirus bronchitis/history of COPD Currently afebrile, no leukocytosis Respiratory virus panel positive for rhinovirus COVID-19 virus negative Repeat chest x-ray showed resolving pulmonary edema, no evidence of pneumonia Supportive treatment, with cough suppressants, inhalers, Xopenex nebulizer at home On Decadron for ITP DME nebulizer machine ordered  New onset A. fib with RVR Likely triggered due to above Status post 2 failed attempts at cardioversion in the ED Heart rate in the 150s on presentation, currently better controlled TSH  WNL EKG showed some ST changes, with rising troponin Chest x-ray showed pulmonary edema likely 2/2 rapid A.  Fib Cardiology on board, recommend discharge on p.o. amiodarone, continue metoprolol Currently not a candidate for anticoagulation given thrombocytopenia, although CHA2DS2-VASc is about 4 (further discussions with primary cardiologist/hematologist in [place])  Elevated troponin/hypertrophic cardiomyopathy 190->2595->3452 Cardiology on board, likely demand ischemia, although not a candidate for any invasive procedures due to ongoing thrombocytopenia Echo EF of greater than 75%, no regional wall motion abnormalities, grade 2 diastolic dysfunction No need for aspirin or heparin due to thrombocytopenia Follow-up with primary cardiologist in [place]  Likely acute on chronic ITP Improving, 60,000 Diagnosed in 2020 in OSH in [place] by hematologist, has been observed since then as platelets have been above 30,000, although slowly trending down Likely triggered by acute viral infection as above Recommended to start dexamethasone for 4 days if platelet count is less than 30,000 Continue dexamethasone for total of 4 days Hematologist consulted, recommend dexamethasone for total of 4 days Follow-up with primary hematologist in [place]  Hyponatremia Resolved History of cardiomyopathy Daily BMP  Hypokalemia Resolved  Hypothyroidism Continue Synthroid          Malnutrition Type:      Malnutrition Characteristics:      Nutrition Interventions:      Estimated body mass index is 18.7 kg/m as calculated from the following:   Height as of this encounter: 4\' 11"  (1.499 m).   Weight as of this encounter: 42 kg.    Procedures:  Cardioversion x2  Consultations:  Cardiology  Heme-onc  Discharge Exam: BP 133/68 (BP Location: Left Arm)   Pulse (!) 56   Temp 97.8 F (36.6 C) (Oral)   Resp 19   Ht 4\' 11"  (1.499 m)   Wt 42 kg   SpO2 100%   BMI 18.70 kg/m   General: NAD, elderly female Cardiovascular: S1, S2 present Respiratory: Diminished breath sounds  bilaterally, with minimal wheezing noted bilaterally  Discharge Instructions You were cared for by a hospitalist during your hospital stay. If you have any questions about your discharge medications or the care you received while you were in the hospital after you are discharged, you can call the unit and asked to speak with the hospitalist on call if the hospitalist that took care of you is not available. Once you are discharged, your primary care physician will handle any further medical issues. Please note that NO REFILLS for any discharge medications will be authorized once you are discharged, as it is imperative that you return to your primary care physician (or establish a relationship with a primary care physician if you do not have one) for your aftercare needs so that they can reassess your need for medications and monitor your lab values.  Discharge Instructions    Diet - low sodium heart healthy   Complete by: As directed    Increase activity slowly   Complete by: As directed      Allergies as of 09/28/2019      Reactions   Diltiazem Swelling, Rash      Medication List    TAKE these medications   alendronate 70 MG tablet Commonly known as: FOSAMAX Take 70 mg by mouth once a week.   amiodarone 200 MG tablet Commonly known as: PACERONE Take 1 tablet (200 mg total) by mouth 2 (two) times daily for 7 days.   amiodarone 200 MG tablet Commonly known as: PACERONE Take 1 tablet (200 mg total) by mouth daily.  Start taking on: October 05, 2019   Daily-Vite Tabs Take 1 tablet by mouth daily.   Dexamethasone 20 MG Tabs Take 20 mg by mouth daily for 2 days. Start taking on: September 29, 2019   Euthyrox 25 MCG tablet Generic drug: levothyroxine Take 25 mcg by mouth daily.   levalbuterol 0.63 MG/3ML nebulizer solution Commonly known as: XOPENEX Inhale 3 mLs (0.63 mg total) into the lungs every 8 (eight) hours for 7 days.   metoprolol succinate 50 MG 24 hr tablet Commonly known as:  TOPROL-XL Take 0.5 tablets (25 mg total) by mouth daily. What changed: how much to take   Mucinex 600 MG 12 hr tablet Generic drug: guaiFENesin Take 1 tablet (600 mg total) by mouth 2 (two) times daily for 7 days.   pantoprazole 40 MG tablet Commonly known as: PROTONIX Take 1 tablet (40 mg total) by mouth daily. Start taking on: September 29, 2019   Symbicort 160-4.5 MCG/ACT inhaler Generic drug: budesonide-formoterol Inhale 2 puffs into the lungs in the morning and at bedtime.            Durable Medical Equipment  (From admission, onward)         Start     Ordered   09/28/19 1045  For home use only DME Nebulizer machine  Once    Question Answer Comment  Patient needs a nebulizer to treat with the following condition COPD (chronic obstructive pulmonary disease) (HCC)   Length of Need 6 Months      09/28/19 1044         Allergies  Allergen Reactions  . Diltiazem Swelling and Rash      The results of significant diagnostics from this hospitalization (including imaging, microbiology, ancillary and laboratory) are listed below for reference.    Significant Diagnostic Studies: DG Chest Port 1 View  Result Date: 09/27/2019 CLINICAL DATA:  Pulmonary edema EXAM: PORTABLE CHEST 1 VIEW COMPARISON:  09/26/2019 FINDINGS: External pacer paddles are noted. The cardiac silhouette, mediastinal and hilar contours are within normal limits and stable. The lungs show improved aeration with resolving edema and atelectasis. No definite pleural effusions. No pneumothorax. IMPRESSION: Improving lung aeration with resolving edema and atelectasis. Electronically Signed   By: Rudie Meyer M.D.   On: 09/27/2019 11:00   DG Chest Portable 1 View  Result Date: 09/26/2019 CLINICAL DATA:  Shortness of breath EXAM: PORTABLE CHEST 1 VIEW COMPARISON:  None. FINDINGS: Cardiac shadow is enlarged. Aortic calcifications are noted. Mild interstitial edema is seen. No focal confluent infiltrate is noted. No  bony abnormality is seen. IMPRESSION: Mild interstitial edema. Electronically Signed   By: Alcide Clever M.D.   On: 09/26/2019 23:26   ECHOCARDIOGRAM COMPLETE  Result Date: 09/27/2019    ECHOCARDIOGRAM REPORT   Patient Name:   Alyssa Nguyen Date of Exam: 09/27/2019 Medical Rec #:  [id number]     Height:       59.0 in Accession #:    [id number]    Weight:       93.7 lb Date of Birth:  May 18, 1941      BSA:          1.336 m Patient Age:    78 years      BP:           140/97 mmHg Patient Gender: F             HR:           63 bpm. Exam Location:  Inpatient Procedure: 3D Echo, 2D Echo, Cardiac Doppler and Color Doppler Indications:    R94.31 Abnormal EKG  History:        Patient has no prior history of Echocardiogram examinations.                 HOCM, Abnormal ECG, COPD; Arrythmias:Atrial Fibrillation.  Sonographer:    Sheralyn Boatman RDCS Referring Phys: [id number] [facility] MARTIN  Sonographer Comments: Image acquisition challenging due to respiratory motion and Image acquisition challenging due to COPD. IMPRESSIONS  1. Vigorous LV systolic function; moderate LVH with proximal septal thickening; grade 2 diastolic dysfunction; chordal SAM with elevated LVOT gradient of 4 m/S (findings c/w HOCM physiology); trace AI; moderate MR; mild LAE; mild TR.  2. Left ventricular ejection fraction, by estimation, is >75%. The left ventricle has hyperdynamic function. The left ventricle has no regional wall motion abnormalities. There is moderate left ventricular hypertrophy. Left ventricular diastolic parameters are consistent with Grade II diastolic dysfunction (pseudonormalization). Elevated left atrial pressure.  3. Right ventricular systolic function is normal. The right ventricular size is normal. There is mildly elevated pulmonary artery systolic pressure.  4. Left atrial size was mildly dilated.  5. The mitral valve is normal in structure. Moderate mitral valve regurgitation. No evidence of mitral stenosis.  6. The aortic valve  is tricuspid. Aortic valve regurgitation is trivial. Mild aortic valve sclerosis is present, with no evidence of aortic valve stenosis.  7. The inferior vena cava is normal in size with greater than 50% respiratory variability, suggesting right atrial pressure of 3 mmHg. FINDINGS  Left Ventricle: Left ventricular ejection fraction, by estimation, is >75%. The left ventricle has hyperdynamic function. The left ventricle has no regional wall motion abnormalities. The left ventricular internal cavity size was normal in size. There is moderate left ventricular hypertrophy. Left ventricular diastolic parameters are consistent with Grade II diastolic dysfunction (pseudonormalization). Elevated left atrial pressure. Right Ventricle: The right ventricular size is normal.Right ventricular systolic function is normal. There is mildly elevated pulmonary artery systolic pressure. The tricuspid regurgitant velocity is 2.85 m/s, and with an assumed right atrial pressure of  3 mmHg, the estimated right ventricular systolic pressure is 35.5 mmHg. Left Atrium: Left atrial size was mildly dilated. Right Atrium: Right atrial size was normal in size. Pericardium: There is no evidence of pericardial effusion. Mitral Valve: The mitral valve is normal in structure. Normal mobility of the mitral valve leaflets. Moderate mitral valve regurgitation. No evidence of mitral valve stenosis. Tricuspid Valve: The tricuspid valve is normal in structure. Tricuspid valve regurgitation is mild . No evidence of tricuspid stenosis. Aortic Valve: The aortic valve is tricuspid. Aortic valve regurgitation is trivial. Mild aortic valve sclerosis is present, with no evidence of aortic valve stenosis. Aortic valve mean gradient measures 9.0 mmHg. Aortic valve peak gradient measures 17.6 mmHg. Aortic valve area, by VTI measures 2.18 cm. Pulmonic Valve: The pulmonic valve was normal in structure. Pulmonic valve regurgitation is trivial. No evidence of pulmonic  stenosis. Aorta: The aortic root is normal in size and structure. Venous: The inferior vena cava is normal in size with greater than 50% respiratory variability, suggesting right atrial pressure of 3 mmHg. IAS/Shunts: No atrial level shunt detected by color flow Doppler. Additional Comments: Vigorous LV systolic function; moderate LVH with proximal septal thickening; grade 2 diastolic dysfunction; chordal SAM with elevated LVOT gradient of 4 m/S (findings c/w HOCM physiology); trace AI; moderate MR; mild LAE; mild TR.  LEFT VENTRICLE PLAX 2D LVIDd:  2.87 cm     Diastology LVIDs:         1.39 cm     LV e' lateral:   6.20 cm/s LV PW:         1.53 cm     LV E/e' lateral: 16.1 LV IVS:        1.60 cm     LV e' medial:    4.57 cm/s LVOT diam:     1.80 cm     LV E/e' medial:  21.8 LV SV:         100 LV SV Index:   75 LVOT Area:     2.54 cm  LV Volumes (MOD) LV vol d, MOD A2C: 67.6 ml LV vol d, MOD A4C: 49.7 ml LV vol s, MOD A2C: 11.0 ml LV vol s, MOD A4C: 17.5 ml LV SV MOD A2C:     56.6 ml LV SV MOD A4C:     49.7 ml LV SV MOD BP:      44.5 ml RIGHT VENTRICLE             IVC RV S prime:     12.30 cm/s  IVC diam: 1.82 cm TAPSE (M-mode): 2.9 cm LEFT ATRIUM           Index       RIGHT ATRIUM           Index LA diam:      2.80 cm 2.10 cm/m  RA Area:     15.30 cm LA Vol (A2C): 47.9 ml 35.85 ml/m RA Volume:   40.70 ml  30.46 ml/m LA Vol (A4C): 20.2 ml 15.12 ml/m  AORTIC VALVE                    PULMONIC VALVE AV Area (Vmax):    2.28 cm     PR End Diast Vel: 2.08 msec AV Area (Vmean):   2.45 cm AV Area (VTI):     2.18 cm AV Vmax:           210.00 cm/s AV Vmean:          135.000 cm/s AV VTI:            0.459 m AV Peak Grad:      17.6 mmHg AV Mean Grad:      9.0 mmHg LVOT Vmax:         188.50 cm/s LVOT Vmean:        130.000 cm/s LVOT VTI:          0.394 m LVOT/AV VTI ratio: 0.86  AORTA Ao Root diam: 3.00 cm MITRAL VALVE                 TRICUSPID VALVE MV Area (PHT): 3.72 cm      TR Peak grad:   32.5 mmHg MV Decel  Time: 204 msec      TR Vmax:        285.00 cm/s MR Peak grad:    125.9 mmHg MR Mean grad:    77.0 mmHg   SHUNTS MR Vmax:         561.00 cm/s Systemic VTI:  0.39 m MR Vmean:        412.0 cm/s  Systemic Diam: 1.80 cm MR PISA:         0.57 cm MR PISA Eff ROA: 3 mm MR PISA Radius:  0.30 cm MV E velocity: 99.80 cm/s MV A velocity: 94.90 cm/s MV E/A ratio:  1.05 Olga Millers MD Electronically signed by Olga Millers MD Signature Date/Time: 09/27/2019/2:32:39 PM    Final     Microbiology: Recent Results (from the past 240 hour(s))  SARS CORONAVIRUS 2 (TAT 6-24 HRS) Nasopharyngeal Nasopharyngeal Swab     Status: None   Collection Time: 09/27/19 12:08 AM   Specimen: Nasopharyngeal Swab  Result Value Ref Range Status   SARS Coronavirus 2 NEGATIVE NEGATIVE Final    Comment: (NOTE) SARS-CoV-2 target nucleic acids are NOT DETECTED. The SARS-CoV-2 RNA is generally detectable in upper and lower respiratory specimens during the acute phase of infection. Negative results do not preclude SARS-CoV-2 infection, do not rule out co-infections with other pathogens, and should not be used as the sole basis for treatment or other patient management decisions. Negative results must be combined with clinical observations, patient history, and epidemiological information. The expected result is Negative. Fact Sheet for Patients: HairSlick.no Fact Sheet for Healthcare Providers: quierodirigir.com This test is not yet approved or cleared by the [place] FDA and  has been authorized for detection and/or diagnosis of SARS-CoV-2 by FDA under an Emergency Use Authorization (EUA). This EUA will remain  in effect (meaning this test can be used) for the duration of the COVID-19 declaration under Section 56 4(b)(1) of the Act, 21 U.S.C. section 360bbb-3(b)(1), unless the authorization is terminated or revoked sooner. Performed at [facility], [address] [place], [place] [address]   MRSA PCR Screening     Status: None   Collection Time: 09/27/19  4:07 AM   Specimen: Nasopharyngeal  Result Value Ref Range Status   MRSA by PCR NEGATIVE NEGATIVE Final    Comment:        The GeneXpert MRSA Assay (FDA approved for NASAL specimens only), is one component of a comprehensive MRSA colonization surveillance program. It is not intended to diagnose MRSA infection nor to guide or monitor treatment for MRSA infections. Performed at [facility], [address] [place], [place] [address]   Respiratory Panel by PCR     Status: Abnormal   Collection Time: 09/27/19 10:57 AM   Specimen: Nasopharyngeal Swab; Respiratory  Result Value Ref Range Status   Adenovirus NOT DETECTED NOT DETECTED Final   Coronavirus 229E NOT DETECTED NOT DETECTED Final    Comment: (NOTE) The Coronavirus on the Respiratory Panel, DOES NOT test for the novel  Coronavirus (2019 nCoV)    Coronavirus HKU1 NOT DETECTED NOT DETECTED Final   Coronavirus NL63 NOT DETECTED NOT DETECTED Final   Coronavirus OC43 NOT DETECTED NOT DETECTED Final   Metapneumovirus NOT DETECTED NOT DETECTED Final   Rhinovirus / Enterovirus DETECTED (A) NOT DETECTED Final   Influenza A NOT DETECTED NOT DETECTED Final   Influenza B NOT DETECTED NOT DETECTED Final   Parainfluenza Virus 1 NOT DETECTED NOT DETECTED Final   Parainfluenza Virus 2 NOT DETECTED NOT DETECTED Final   Parainfluenza Virus 3 NOT DETECTED NOT DETECTED Final   Parainfluenza Virus 4 NOT DETECTED NOT DETECTED Final   Respiratory Syncytial Virus NOT DETECTED NOT DETECTED Final   Bordetella pertussis NOT DETECTED NOT DETECTED Final   Chlamydophila pneumoniae NOT DETECTED NOT DETECTED Final   Mycoplasma pneumoniae NOT DETECTED NOT DETECTED Final    Comment: Performed at [facility], [address], [place], [place] [address]  Culture, Urine     Status: Abnormal   Collection Time: 09/27/19 11:48 AM   Specimen: Urine,  Random  Result Value Ref Range Status  Specimen Description URINE, RANDOM  Final   Special Requests NONE  Final   Culture (A)  Final    <10,000 COLONIES/mL INSIGNIFICANT GROWTH Performed at [facility], [address] [place], [place] [address]    Report Status 09/28/2019 FINAL  Final     Labs: Basic Metabolic Panel: Recent Labs  Lab 09/26/19 2239 09/27/19 0139 09/27/19 1439 09/28/19 0218  NA 134* 132* 131* 137  K 3.3* 3.0* 4.4 4.9  CL 100 99 106 107  CO2 19* 22 19* 24  GLUCOSE 148* 170* 355* 162*  BUN 9 10 12 9   CREATININE 0.60 0.63 0.63 0.58  CALCIUM 9.4 8.3* 9.0 9.4  MG 1.8 2.8*  --   --    Liver Function Tests: Recent Labs  Lab 09/27/19 0139 09/28/19 0218  AST 39 41  ALT 34 31  ALKPHOS 47 43  BILITOT 1.2 <0.1*  PROT 6.2* 5.9*  ALBUMIN 3.0* 2.8*   No results for input(s): LIPASE, AMYLASE in the last 168 hours. No results for input(s): AMMONIA in the last 168 hours. CBC: Recent Labs  Lab 09/26/19 2239 09/27/19 0139 09/28/19 0218  WBC 9.8 10.1 7.8  NEUTROABS  --  7.7 6.2  HGB 13.1 12.3 11.5*  HCT 40.8 38.7 35.7*  MCV 88.5 90.8 88.8  PLT 30* 25* 60*   Cardiac Enzymes: No results for input(s): CKTOTAL, CKMB, CKMBINDEX, TROPONINI in the last 168 hours. BNP: BNP (last 3 results) Recent Labs    09/26/19 2239  BNP 479.0*    ProBNP (last 3 results) No results for input(s): PROBNP in the last 8760 hours.  CBG: Recent Labs  Lab 09/27/19 2145 09/28/19 0622 09/28/19 1119  GLUCAP 207* 160* 102*       Signed:  11/28/19, MD [facility] 09/28/2019, 3:45 PM

## 2019-09-28 NOTE — Progress Notes (Signed)
Hospital follow-up, schedule office visit with Truett Perna or Misty Stanley week of 10/08/2019, will need a CBC. Scheduling message sent.

## 2019-09-28 NOTE — Progress Notes (Signed)
Cardiology Progress Note  Patient ID: Alyssa Nguyen MRN: [id number] DOB: Jan 12, 1941 Date of Encounter: 09/28/2019  Primary Cardiologist: No primary care provider on file.  Subjective  In NSR. Echo with gradient 14-15 mmHg on my review. Wheezing on exam. CXR improved.   ROS:  All other ROS reviewed and negative. Pertinent positives noted in the HPI.     Inpatient Medications  Scheduled Meds: . amiodarone  200 mg Oral BID   Followed by  . [START ON 10/05/2019] amiodarone  200 mg Oral Daily  . dexamethasone  20 mg Oral Daily  . insulin aspart  0-9 Units Subcutaneous TID WC  . levalbuterol  0.63 mg Inhalation Q6H  . levothyroxine  25 mcg Oral QAC breakfast  . metoprolol succinate  25 mg Oral Daily  . mometasone-formoterol  2 puff Inhalation BID  . pantoprazole  40 mg Oral Daily  . sodium chloride flush  3 mL Intravenous Q12H  . sodium chloride flush  3 mL Intravenous Q12H   Continuous Infusions: . sodium chloride     PRN Meds: sodium chloride, acetaminophen **OR** acetaminophen, guaiFENesin, HYDROcodone-acetaminophen, ondansetron **OR** ondansetron (ZOFRAN) IV, sodium chloride flush   Vital Signs   Vitals:   09/28/19 0323 09/28/19 0718 09/28/19 0844 09/28/19 0926  BP: 120/77 133/68    Pulse: (!) 53 (!) 52 (!) 56   Resp: 13 19    Temp: 97.6 F (36.4 C) 97.6 F (36.4 C)    TempSrc: Oral Oral    SpO2: 97% 100%  100%  Weight: 42 kg     Height:        Intake/Output Summary (Last 24 hours) at 09/28/2019 1031 Last data filed at 09/28/2019 0630 Gross per 24 hour  Intake 795.84 ml  Output 950 ml  Net -154.16 ml   Last 3 Weights 09/28/2019 09/27/2019 09/26/2019  Weight (lbs) 92 lb 9.5 oz 93 lb 11.1 oz 95 lb  Weight (kg) 42 kg 42.5 kg 43.092 kg      Telemetry  Overnight telemetry shows Afib with intermittent NSR, which I personally reviewed.   ECG  The most recent ECG shows Sinus bradycardia with LVH which I personally reviewed.   Physical Exam   Vitals:   09/28/19 0323  09/28/19 0718 09/28/19 0844 09/28/19 0926  BP: 120/77 133/68    Pulse: (!) 53 (!) 52 (!) 56   Resp: 13 19    Temp: 97.6 F (36.4 C) 97.6 F (36.4 C)    TempSrc: Oral Oral    SpO2: 97% 100%  100%  Weight: 42 kg     Height:         Intake/Output Summary (Last 24 hours) at 09/28/2019 1031 Last data filed at 09/28/2019 0630 Gross per 24 hour  Intake 795.84 ml  Output 950 ml  Net -154.16 ml    Last 3 Weights 09/28/2019 09/27/2019 09/26/2019  Weight (lbs) 92 lb 9.5 oz 93 lb 11.1 oz 95 lb  Weight (kg) 42 kg 42.5 kg 43.092 kg    Body mass index is 18.7 kg/m.   General: Well nourished, well developed, in no acute distress Head: Atraumatic, normal size  Eyes: PEERLA, EOMI  Neck: Supple, no JVD Endocrine: No thryomegaly Cardiac: faint 2/6 SEM Lungs: rhonchi bilaterally  Abd: Soft, nontender, no hepatomegaly  Ext: No edema, pulses 2+ Musculoskeletal: No deformities, BUE and BLE strength normal and equal Skin: Warm and dry, no rashes   Neuro: Alert and oriented to person, place, time, and situation, CNII-XII grossly intact, no  focal deficits  Psych: Normal mood and affect   Labs  High Sensitivity Troponin:   Recent Labs  Lab 09/26/19 2239 09/27/19 0139 09/27/19 0536 09/27/19 0751  TROPONINIHS 15 130* 2,595* 3,452*     Cardiac EnzymesNo results for input(s): TROPONINI in the last 168 hours. No results for input(s): TROPIPOC in the last 168 hours.  Chemistry Recent Labs  Lab 09/27/19 0139 09/27/19 1439 09/28/19 0218  NA 132* 131* 137  K 3.0* 4.4 4.9  CL 99 106 107  CO2 22 19* 24  GLUCOSE 170* 355* 162*  BUN CREATININE 0.63 0.63 0.58  CALCIUM 8.3* 9.0 9.4  PROT 6.2*  --  5.9*  ALBUMIN 3.0*  --  2.8*  AST 39  --  41  ALT 34  --  31  ALKPHOS 47  --  43  BILITOT 1.2  --  <0.1*  GFRNONAA >60 >60 >60  GFRAA >60 >60 >60  ANIONGAP Hematology Recent Labs  Lab 09/26/19 2239 09/27/19 0139 09/28/19 0218  WBC 9.8 10.1 7.8  RBC 4.61 4.26 4.02  HGB 13.1  12.3 11.5*  HCT 40.8 38.7 35.7*  MCV 88.5 90.8 88.8  MCH 28.4 28.9 28.6  MCHC 32.1 31.8 32.2  RDW 12.5 12.7 12.7  PLT 30* 25* 60*   BNP Recent Labs  Lab 09/26/19 2239  BNP 479.0*    DDimer No results for input(s): DDIMER in the last 168 hours.   Radiology  DG Chest Port 1 View  Result Date: 09/27/2019 CLINICAL DATA:  Pulmonary edema EXAM: PORTABLE CHEST 1 VIEW COMPARISON:  09/26/2019 FINDINGS: External pacer paddles are noted. The cardiac silhouette, mediastinal and hilar contours are within normal limits and stable. The lungs show improved aeration with resolving edema and atelectasis. No definite pleural effusions. No pneumothorax. IMPRESSION: Improving lung aeration with resolving edema and atelectasis. Electronically Signed   By: Rudie Meyer M.D.   On: 09/27/2019 11:00   DG Chest Portable 1 View  Result Date: 09/26/2019 CLINICAL DATA:  Shortness of breath EXAM: PORTABLE CHEST 1 VIEW COMPARISON:  None. FINDINGS: Cardiac shadow is enlarged. Aortic calcifications are noted. Mild interstitial edema is seen. No focal confluent infiltrate is noted. No bony abnormality is seen. IMPRESSION: Mild interstitial edema. Electronically Signed   By: Alcide Clever M.D.   On: 09/26/2019 23:26   ECHOCARDIOGRAM COMPLETE  Result Date: 09/27/2019    ECHOCARDIOGRAM REPORT   Patient Name:   Alyssa Nguyen Date of Exam: 09/27/2019 Medical Rec #:  [id number]     Height:       59.0 in Accession #:    [id number]    Weight:       93.7 lb Date of Birth:  [birth year]      BSA:          1.336 m Patient Age:    78 years      BP:           140/97 mmHg Patient Gender: F             HR:           63 bpm. Exam Location:  Inpatient Procedure: 3D Echo, 2D Echo, Cardiac Doppler and Color Doppler Indications:    R94.31 Abnormal EKG  History:        Patient has no prior history of Echocardiogram examinations.                 HOCM, Abnormal  ECG, COPD; Arrythmias:Atrial Fibrillation.  Sonographer:    Sheralyn Boatmanina West RDCS Referring Phys:  [id number] [facility]ARTIN  Sonographer Comments: Image acquisition challenging due to respiratory motion and Image acquisition challenging due to COPD. IMPRESSIONS  1. Vigorous LV systolic function; moderate LVH with proximal septal thickening; grade 2 diastolic dysfunction; chordal SAM with elevated LVOT gradient of 4 m/S (findings c/w HOCM physiology); trace AI; moderate MR; mild LAE; mild TR.  2. Left ventricular ejection fraction, by estimation, is >75%. The left ventricle has hyperdynamic function. The left ventricle has no regional wall motion abnormalities. There is moderate left ventricular hypertrophy. Left ventricular diastolic parameters are consistent with Grade II diastolic dysfunction (pseudonormalization). Elevated left atrial pressure.  3. Right ventricular systolic function is normal. The right ventricular size is normal. There is mildly elevated pulmonary artery systolic pressure.  4. Left atrial size was mildly dilated.  5. The mitral valve is normal in structure. Moderate mitral valve regurgitation. No evidence of mitral stenosis.  6. The aortic valve is tricuspid. Aortic valve regurgitation is trivial. Mild aortic valve sclerosis is present, with no evidence of aortic valve stenosis.  7. The inferior vena cava is normal in size with greater than 50% respiratory variability, suggesting right atrial pressure of 3 mmHg. FINDINGS  Left Ventricle: Left ventricular ejection fraction, by estimation, is >75%. The left ventricle has hyperdynamic function. The left ventricle has no regional wall motion abnormalities. The left ventricular internal cavity size was normal in size. There is moderate left ventricular hypertrophy. Left ventricular diastolic parameters are consistent with Grade II diastolic dysfunction (pseudonormalization). Elevated left atrial pressure. Right Ventricle: The right ventricular size is normal.Right ventricular systolic function is normal. There is mildly elevated pulmonary  artery systolic pressure. The tricuspid regurgitant velocity is 2.85 m/s, and with an assumed right atrial pressure of  3 mmHg, the estimated right ventricular systolic pressure is 35.5 mmHg. Left Atrium: Left atrial size was mildly dilated. Right Atrium: Right atrial size was normal in size. Pericardium: There is no evidence of pericardial effusion. Mitral Valve: The mitral valve is normal in structure. Normal mobility of the mitral valve leaflets. Moderate mitral valve regurgitation. No evidence of mitral valve stenosis. Tricuspid Valve: The tricuspid valve is normal in structure. Tricuspid valve regurgitation is mild . No evidence of tricuspid stenosis. Aortic Valve: The aortic valve is tricuspid. Aortic valve regurgitation is trivial. Mild aortic valve sclerosis is present, with no evidence of aortic valve stenosis. Aortic valve mean gradient measures 9.0 mmHg. Aortic valve peak gradient measures 17.6 mmHg. Aortic valve area, by VTI measures 2.18 cm. Pulmonic Valve: The pulmonic valve was normal in structure. Pulmonic valve regurgitation is trivial. No evidence of pulmonic stenosis. Aorta: The aortic root is normal in size and structure. Venous: The inferior vena cava is normal in size with greater than 50% respiratory variability, suggesting right atrial pressure of 3 mmHg. IAS/Shunts: No atrial level shunt detected by color flow Doppler. Additional Comments: Vigorous LV systolic function; moderate LVH with proximal septal thickening; grade 2 diastolic dysfunction; chordal SAM with elevated LVOT gradient of 4 m/S (findings c/w HOCM physiology); trace AI; moderate MR; mild LAE; mild TR.  LEFT VENTRICLE PLAX 2D LVIDd:         2.87 cm     Diastology LVIDs:         1.39 cm     LV e' lateral:   6.20 cm/s LV PW:         1.53 cm  LV E/e' lateral: 16.1 LV IVS:        1.60 cm     LV e' medial:    4.57 cm/s LVOT diam:     1.80 cm     LV E/e' medial:  21.8 LV SV:         100 LV SV Index:   75 LVOT Area:     2.54 cm   LV Volumes (MOD) LV vol d, MOD A2C: 67.6 ml LV vol d, MOD A4C: 49.7 ml LV vol s, MOD A2C: 11.0 ml LV vol s, MOD A4C: 17.5 ml LV SV MOD A2C:     56.6 ml LV SV MOD A4C:     49.7 ml LV SV MOD BP:      44.5 ml RIGHT VENTRICLE             IVC RV S prime:     12.30 cm/s  IVC diam: 1.82 cm TAPSE (M-mode): 2.9 cm LEFT ATRIUM           Index       RIGHT ATRIUM           Index LA diam:      2.80 cm 2.10 cm/m  RA Area:     15.30 cm LA Vol (A2C): 47.9 ml 35.85 ml/m RA Volume:   40.70 ml  30.46 ml/m LA Vol (A4C): 20.2 ml 15.12 ml/m  AORTIC VALVE                    PULMONIC VALVE AV Area (Vmax):    2.28 cm     PR End Diast Vel: 2.08 msec AV Area (Vmean):   2.45 cm AV Area (VTI):     2.18 cm AV Vmax:           210.00 cm/s AV Vmean:          135.000 cm/s AV VTI:            0.459 m AV Peak Grad:      17.6 mmHg AV Mean Grad:      9.0 mmHg LVOT Vmax:         188.50 cm/s LVOT Vmean:        130.000 cm/s LVOT VTI:          0.394 m LVOT/AV VTI ratio: 0.86  AORTA Ao Root diam: 3.00 cm MITRAL VALVE                 TRICUSPID VALVE MV Area (PHT): 3.72 cm      TR Peak grad:   32.5 mmHg MV Decel Time: 204 msec      TR Vmax:        285.00 cm/s MR Peak grad:    125.9 mmHg MR Mean grad:    77.0 mmHg   SHUNTS MR Vmax:         561.00 cm/s Systemic VTI:  0.39 m MR Vmean:        412.0 cm/s  Systemic Diam: 1.80 cm MR PISA:         0.57 cm MR PISA Eff ROA: 3 mm MR PISA Radius:  0.30 cm MV E velocity: 99.80 cm/s MV A velocity: 94.90 cm/s MV E/A ratio:  1.05 Olga Millers MD Electronically signed by Olga Millers MD Signature Date/Time: 09/27/2019/2:32:39 PM    Final     Cardiac Studies  TTE 06-29-18 CONCLUSIONS: -The left ventricle is normal in size. There is moderate concentric left ventricular hypertrophy. Left ventricular  systolic function is hyperdynamic. EF = 80% (visual est.) Left ventricular diastolic function is consistent with abnormal relaxation (stage I). -The right ventricle is normal in size. Right ventricular systolic  function is normal. The estimated right ventricular systolic pressure is 16.1 mmHg plus the right atrial pressure. The estimated right ventricular systolic pressure is 32 mmHg. The right atrial pressure is 3 mmHg. Finding is consistent with normal pulmonary artery pressures. -There is moderate mitral valve regurgitation likely related to chordal SAM. -There is mild tricuspid valve regurgitation. -Chordal SAM causing LVOT obstruction and moderate mitral regurgitation in the setting of septal hypertrophy and hyperdynamic LV function. LVOT obstruction with SAM peak gradient of 8mmHg at rest that increases to 102 mmHg with valsalva.  Holter Monitor 09/01/18  Symptom Finding Correlation: The rhythm strips do not correlate with the symptoms. The holter study was abnormal.   1) The predominant rhythm was sinus rhythm   2) Nineteen (19 )Supraventricular Tachycardia runs occurred, the run with the fastest interval lasting 4 beats with a max rate of 200 bpm, the longest lasting 18 beats with an avg rate of 125 bpm.  3) Isolated SVEswere occasional (2.3%, 09604), SVE Couplets were rare (<1.0%, 408), and SVETriplets were rare (<1.0%, 21).   4) Isolated VEs were rare (<1.0%), and no VE Couplets or VE Triplets were present. Ventricular Bigeminy was present.   5) No symptoms with triggered events  Cardiac MRI 12/06/18 IMPRESSION: 1. Normal left ventricular size and function with LV ejection fraction calculated at 71%. There are no regional wall motion abnormalities. 2. Normal right ventricular size and function with RV ejection fraction calculated at 65%. 3. There is severe hypertrophy of the basal anteroseptal wall which measures up to 17 mm. There is systolic anterior motion (SAM) of the mitral valve seen.  4. There is no evidence of abnormal delayed enhancement in the left ventricle to suggest fibrous changes / scarring. However some of the basal slices are missing from the delayed enhancement  series.  Genetic Testing 01/24/19 The results are NEGATIVE.  Patient Profile  Alyssa Nguyen is a 79 y.o. female with hypertrophic CM (suspect basal septum variant) who was admitted 09/26/2019 for Afib with RVR and possible respiratory infection.   Assessment & Plan   1.  New onset atrial fibrillation with RVR -back in NSR -triggered by respiratory illness -no AC due to ITP -continue amiodarone 200 mg BID x 7 days and the 200 mg x 21 days and stop. I suspect this is secondary Afib triggered by RSV. She can be evaluated for long-term need for anti-arrhythmics by her cardiologist in [place] -will need LFTs rechecked in a few weeks -mertis AC due to HOCM but ITP precludes this  2.  Elevated troponin -Demand in the setting of hypertrophic dermopathy.   -echo without WMA -no further work-up -no ASA due to ITP  3.  Hypertrophic cardiomyopathy, likely sigmoid septum variant -echo here with peak gradient 15 mmHG from MR signal and 16 mmHG from CW. Murmur better this AM.  -I suspect she had pulmonary edema from MR when HR was 150 -CXR without edema now -rhonchi due to RSV -reduce metoprolol succinate 25 mg QD. HR in 43s and good for HOCM -MR is mild on my review   [facility] will sign off.   Medication Recommendations:  Amiodarone 200 mg BID x 7 days and then 200 mg daily x 21 days. No anticoagulation. Reduce metoprolol succinate to 25 mg QD Other recommendations (labs, testing, etc):  Follow up as an outpatient:  Follow-up with cardiologist in [place]   For questions or updates, please contact [facility] Please consult [email or web address] for contact info under     Signed, Gerri Spore T. Flora Lipps, MD [facility]  [facility]  09/28/2019 10:31 AM

## 2019-10-02 ENCOUNTER — Other Ambulatory Visit: Payer: Self-pay | Admitting: Cardiovascular Disease

## 2019-10-02 ENCOUNTER — Telehealth: Payer: Self-pay | Admitting: Cardiovascular Disease

## 2019-10-02 MED ORDER — PREDNISONE 10 MG (21) PO TBPK: ORAL_TABLET | ORAL | 0 refills | Status: AC

## 2019-10-02 NOTE — Telephone Encounter (Signed)
Spoke with patient's son. He reports patient has a wheeze and shortness of breath with exertion. No other symptoms at this time. Son would like for Dr. Flora Lipps to start patient on a prednisone taper. He reports patient was on 2 days of dexamethasone and when the medication stopped she felt worse. Patient placed on Krista Kroeger's schedule for Thursday AM for evaluation of ongoing shortness of breath. Patient's son would like something to be done in the meantime until she can be seen. Will route to MD for review.

## 2019-10-02 NOTE — Telephone Encounter (Signed)
I called in a steroid taper. She will need an EKG when she comes to the office.   Gerri Spore T. Flora Lipps, MD [facility]  [address], [address] [place], [place] [address] [phone]  12:39 PM

## 2019-10-02 NOTE — Progress Notes (Signed)
Spike with Mrs. Gentile's son about her. Apparently more short of breath since coming off steroids. Will add back steriod taper. 40 mg daily x 3 days, 20 mg x 3 days, 10 mg x 3 days. Suspect she needs taper given recent RSV. Will need to make sure she is not back in Afib. Appointment later this week with Judy Pimple. I will be in office too.   Gerri Spore T. Flora Lipps, MD [facility]  [address] [place], [place] [address] [phone]  12:39 PM

## 2019-10-02 NOTE — Telephone Encounter (Signed)
New Message  Patient's son (Dr. Allena Katz) is calling in to speak with Dr. Flora Lipps. States that patient is still short of breath after coming home from the hospital. Wants to know if patient needs to possibly be put on a z pack (steriod) for the shortness of breath. Please give patient's son (Dr. Allena Katz) a call back to discuss.

## 2019-10-03 ENCOUNTER — Other Ambulatory Visit: Payer: Self-pay | Admitting: Oncology

## 2019-10-03 ENCOUNTER — Encounter: Payer: Self-pay | Admitting: Medical

## 2019-10-03 NOTE — Progress Notes (Addendum)
Cardiology Office Note   Date:  10/04/2019   ID:  Alyssa Nguyen, DOB 05/28/1941, MRN [id number]  PCP:  System, Provider Not In  Cardiologist:  Dr. Preston Fleeting in [place]; seen by Dr. Audie Box during recent hospitalization EP: None  Chief Complaint  Patient presents with  . Hospitalization Follow-up    SOB      History of Present Illness: Alyssa Nguyen is a 79 y.o. female with PMH of HCM (suspected basal septum variant), recently diagnosed paroxysmal atrial fibrillation, hypothyroidism, emphysema, and ITP, who presents for post-hospital follow-up.  She was admitted to [facility] from 09/26/19-482/21 after presenting with SOB and bilateral shoulder pain where she was found to have new onset atrial fibrillation in the setting of rhinovirus bronchitis. She was followed by cardiology throughout her admission. She had 2 failed cardioversion attempts in the ED and ultimately converted to sinus rhythm with amiodarone and IV diltiazem. She was discharged home on metoprolol and amiodarone for rate/rhythm control. Due to her underlying ITP she was not a candidate for anticoagulation at this time. She had an elevated troponin at that time, peaked at 3400; felt to be demand ischemia in the setting of afib with RVR and a viral infection. Echo 09/27/19 showed EF>75%, moderate LVH with proximal septal thickening, G2DD, chordal SAM with elevated LVOT gradient of 4 m/S, moderate MR, mild LAE, and mild TR.   Her son contacted our office 10/02/19 to report worsening SOB and wheezing since stopping steroids. Dr. Audie Box sent a prescription for a steroid taper and she was scheduled to follow-up for further evaluation.   She presents today with her daughter-in-law. She has had improvement in her SOB with restarting steroids. Family reports she had significant DOE within 24 hours of completing steroid course post-discharge. Also with associated wheezing and LE edema. Both of these have improved. Family also notes she was  not using her symbicort initially, though they educated her on use and she has now been taking as prescribed. No complaints of chest pain or palpitations. No dizziness, lightheadedness, or syncope. She has tolerated metoprolol 50mg  daily since discharge and HR's have been stable. Family reports her hematologist is considering pulse steroids for management of ITP. She is scheduled to be seen by her cardiologist 10/17/19 upon return to [place].    Past Medical History:  Diagnosis Date  . Hypertrophic cardiomyopathy ([place])   . Idiopathic thrombocytopenic purpura (ITP) (HCC)   . Paroxysmal atrial fibrillation (H[facility])   . Pre-diabetes     No past surgical history on file.   Current Outpatient Medications  Medication Sig Dispense Refill  . alendronate (FOSAMAX) 70 MG tablet Take 70 mg by mouth once a week.    Marland Kitchen amiodarone (PACERONE) 200 MG tablet Take 1 tablet (200 mg total) by mouth 2 (two) times daily for 7 days. 14 tablet 0  . [START ON 10/05/2019] amiodarone (PACERONE) 200 MG tablet Take 1 tablet (200 mg total) by mouth daily. 30 tablet 0  . budesonide-formoterol (SYMBICORT) 160-4.5 MCG/ACT inhaler Inhale 2 puffs into the lungs in the morning and at bedtime.    [name] 25 MCG tablet Take 25 mcg by mouth daily.    Marland Kitchen guaiFENesin (MUCINEX) 600 MG 12 hr tablet Take 1 tablet (600 mg total) by mouth 2 (two) times daily for 7 days. 14 tablet 0  . levalbuterol (XOPENEX) 0.63 MG/3ML nebulizer solution Inhale 3 mLs (0.63 mg total) into the lungs every 8 (eight) hours for 7 days. 63 mL 0  .  metoprolol succinate (TOPROL-XL) 50 MG 24 hr tablet Take 0.5 tablets (25 mg total) by mouth daily. (Patient taking differently: Take 50 mg by mouth daily. ) 15 tablet 0  . Multiple Vitamin (DAILY-VITE) TABS Take 1 tablet by mouth daily.    . pantoprazole (PROTONIX) 40 MG tablet Take 1 tablet (40 mg total) by mouth daily. 30 tablet 0  . predniSONE (STERAPRED UNI-PAK 21 TAB) 10 MG (21) TBPK tablet Take 4 tablets (40 mg  total) by mouth daily for 3 days, THEN 2 tablets (20 mg total) daily for 3 days, THEN 1 tablet (10 mg total) daily for 3 days. 21 tablet 0   No current facility-administered medications for this visit.    Allergies:   Diltiazem    Social History:  The patient  reports that she has never smoked. She has never used smokeless tobacco. She reports that she does not drink alcohol or use drugs.   Family History:  The patient's family history is not on file.    ROS:  Please see the history of present illness.   Otherwise, review of systems are positive for none.   All other systems are reviewed and negative.    PHYSICAL EXAM: VS:  BP 124/62   Pulse 63   Ht 4\' 11"  (1.499 m)   Wt 88 lb 6.4 oz (40.1 kg)   SpO2 93%   BMI 17.85 kg/m  , BMI Body mass index is 17.85 kg/m. GEN: Well nourished, well developed, in no acute distress HEENT: sclera anicteric Neck: no JVD, carotid bruits, or masses Cardiac: RRR; +murmur, no rubs or gallops, no edema  Respiratory:  clear to auscultation bilaterally, normal work of breathing GI: soft, nontender, nondistended, + BS MS: no deformity or atrophy Skin: warm and dry, no rash Neuro:  Strength and sensation are intact Psych: euthymic mood, full affect   EKG:  EKG is ordered today. The ekg ordered today demonstrates sinus rhythm with sinus arrhythmia, rate 63 bpm, LVH with repol abnormalities, no STE/D, no TWI.   Recent Labs: 09/26/2019: B Natriuretic Peptide 479.0 09/27/2019: Magnesium 2.8; TSH 3.571 09/28/2019: ALT 31; BUN 9; Creatinine, Ser 0.58; Hemoglobin 11.5; Platelets 60; Potassium 4.9; Sodium 137    Lipid Panel No results found for: CHOL, TRIG, HDL, CHOLHDL, VLDL, LDLCALC, LDLDIRECT    Wt Readings from Last 3 Encounters:  10/04/19 88 lb 6.4 oz (40.1 kg)  09/28/19 92 lb 9.5 oz (42 kg)      Other studies Reviewed: Additional studies/ records that were reviewed today include:   Echocardiogram 09/27/19: 1. Vigorous LV systolic function;  moderate LVH with proximal septal  thickening; grade 2 diastolic dysfunction; chordal SAM with elevated LVOT  gradient of 4 m/S (findings c/w HOCM physiology); trace AI; moderate MR;  mild LAE; mild TR.  2. Left ventricular ejection fraction, by estimation, is >75%. The left  ventricle has hyperdynamic function. The left ventricle has no regional  wall motion abnormalities. There is moderate left ventricular hypertrophy.  Left ventricular diastolic  parameters are consistent with Grade II diastolic dysfunction  (pseudonormalization). Elevated left atrial pressure.  3. Right ventricular systolic function is normal. The right ventricular  size is normal. There is mildly elevated pulmonary artery systolic  pressure.  4. Left atrial size was mildly dilated.  5. The mitral valve is normal in structure. Moderate mitral valve  regurgitation. No evidence of mitral stenosis.  6. The aortic valve is tricuspid. Aortic valve regurgitation is trivial.  Mild aortic valve sclerosis is present,  with no evidence of aortic valve  stenosis.  7. The inferior vena cava is normal in size with greater than 50%  respiratory variability, suggesting right atrial pressure of 3 mmHg.    ASSESSMENT AND PLAN:  1. Paroxysmal atrial fibrillation: maintaining sinus rhythm. Not on anticoagulation due to underlying ITP - Continue amiodarone 200mg  BID for rhythm control with plans to taper to 200mg  daily on 11/04/19 - Continue metoprolol for rate control - Will check CBC with diff at the request of Dr. with oncology  2. HCM: known history of HCM. Echo durign recent admission with peak gradient of 15 mmHG from MR signal and 16 mmHg from CW per Dr. 01/04/20 .  - Continue metoprolol succinate 50mg  daily  3. ITP: PLT 60 09/28/19; limits anticoagulation for management of #1 - Will check CBC with diff at the request of Dr. Flora Lipps with oncology  4. Pre-DM type 2: A1C 6.0 - Encouraged dietary/lifestyle  modifications to promote improved blood sugars  5. Recent respiratory infection in patient with emphysema: resently admitted with RSV c/b afib with RVR. Discharged home on steroid taper though called the office 10/02/19 with worsening SOB, for which an additional steroid taper was prescribed by Dr. 11/28/19 (40mg  x3 days, 20mg  x3 days, 10mg  x3 days, then off).    Current medicines are reviewed at length with the patient today.  The patient does not have concerns regarding medicines.  The following changes have been made:  As above  Labs/ tests ordered today include:   Orders Placed This Encounter  Procedures  . CBC with Differential/Platelet  . EKG 12-Lead     Disposition:   FU with Dr. Truett Perna as scheduled 10/17/19.  Signed, Flora Lipps, PA-C  10/04/2019 10:59 AM

## 2019-10-03 NOTE — Progress Notes (Signed)
I discussed the case with her son, Dr. Allena Katz.  He indicates she is having more respiratory symptoms and will be seen by cardiology tomorrow.  I recommended he asked the cardiologist to check a CBC while there.  I recommended she follow-up with her cardiologist and hematologist upon return to [place].  She can continue pulse Decadron therapy as indicated.  I am available to see her as needed.

## 2019-10-04 ENCOUNTER — Ambulatory Visit (INDEPENDENT_AMBULATORY_CARE_PROVIDER_SITE_OTHER): Payer: Medicare Other | Admitting: Medical

## 2019-10-04 ENCOUNTER — Encounter: Payer: Self-pay | Admitting: Medical

## 2019-10-04 ENCOUNTER — Other Ambulatory Visit: Payer: Self-pay

## 2019-10-04 VITALS — BP 124/62 | HR 63 | Ht 59.0 in | Wt 88.4 lb

## 2019-10-04 DIAGNOSIS — I480 Paroxysmal atrial fibrillation: Secondary | ICD-10-CM | POA: Diagnosis not present

## 2019-10-04 DIAGNOSIS — D693 Immune thrombocytopenic purpura: Secondary | ICD-10-CM | POA: Diagnosis not present

## 2019-10-04 DIAGNOSIS — I422 Other hypertrophic cardiomyopathy: Secondary | ICD-10-CM

## 2019-10-04 DIAGNOSIS — R7303 Prediabetes: Secondary | ICD-10-CM

## 2019-10-04 DIAGNOSIS — J206 Acute bronchitis due to rhinovirus: Secondary | ICD-10-CM

## 2019-10-04 NOTE — Patient Instructions (Signed)
Medication Instructions:  Your physician recommends that you continue on your current medications as directed. Please refer to the Current Medication list given to you today.  *If you need a refill on your cardiac medications before your next appointment, please call your pharmacy*   Lab Work: Your physician recommends that you return for lab work in TODAY:  CBC w/Diff  If you have labs (blood work) drawn today and your tests are completely normal, you will receive your results only by: Marland Kitchen MyChart Message (if you have MyChart) OR . A paper copy in the mail If you have any lab test that is abnormal or we need to change your treatment, we will call you to review the results.  Testing/Procedures: NONE ordered at this time of appointment   Follow-Up: At [facility], you and your health needs are our priority.  As part of our continuing mission to provide you with exceptional heart care, we have created designated Provider Care Teams.  These Care Teams include your primary Cardiologist (physician) and Advanced Practice Providers (APPs -  Physician Assistants and Nurse Practitioners) who all work together to provide you with the care you need, when you need it.  We recommend signing up for the patient portal called "MyChart".  Sign up information is provided on this After Visit Summary.  MyChart is used to connect with patients for Virtual Visits (Telemedicine).  Patients are able to view lab/test results, encounter notes, upcoming appointments, etc.  Non-urgent messages can be sent to your provider as well.   To learn more about what you can do with MyChart, go to [email or web address].    Your next appointment:   Follow up with you Cardiologist on 10/17/2018 in [place]   The format for your next appointment:   In Person  Provider:    Other Instructions

## 2019-10-05 ENCOUNTER — Telehealth: Payer: Self-pay | Admitting: *Deleted

## 2019-10-05 NOTE — Telephone Encounter (Addendum)
Called son to f/u on if labs were done at cardiology office yesterday? He states they could not wait due to having another appointment. She is going to [facility] today at 1 pm for labs. Call to [facility] at 1545: No results available yet.

## 2019-10-06 LAB — CBC WITH DIFFERENTIAL/PLATELET
Basophils Absolute: 0 10*3/uL (ref 0.0–0.2)
Basos: 0 %
EOS (ABSOLUTE): 0 10*3/uL (ref 0.0–0.4)
Eos: 0 %
Hematocrit: 45.8 % (ref 34.0–46.6)
Hemoglobin: 14.6 g/dL (ref 11.1–15.9)
Immature Grans (Abs): 0.1 10*3/uL (ref 0.0–0.1)
Immature Granulocytes: 1 %
Lymphocytes Absolute: 1 10*3/uL (ref 0.7–3.1)
Lymphs: 5 %
MCH: 28.6 pg (ref 26.6–33.0)
MCHC: 31.9 g/dL (ref 31.5–35.7)
MCV: 90 fL (ref 79–97)
Monocytes Absolute: 0.4 10*3/uL (ref 0.1–0.9)
Monocytes: 2 %
Neutrophils Absolute: 18.4 10*3/uL — ABNORMAL HIGH (ref 1.4–7.0)
Neutrophils: 92 %
Platelets: 186 10*3/uL (ref 150–450)
RBC: 5.1 x10E6/uL (ref 3.77–5.28)
RDW: 12.7 % (ref 11.7–15.4)
WBC: 20 10*3/uL (ref 3.4–10.8)

## 2019-10-07 ENCOUNTER — Other Ambulatory Visit: Payer: Self-pay | Admitting: Physician Assistant

## 2019-10-07 MED ORDER — LEVALBUTEROL HCL 0.63 MG/3ML IN NEBU: 0.6300 mg | INHALATION_SOLUTION | Freq: Three times a day (TID) | RESPIRATORY_TRACT | 0 refills | Status: AC

## 2019-10-07 MED ORDER — LEVALBUTEROL HCL 0.63 MG/3ML IN NEBU: 0.6300 mg | INHALATION_SOLUTION | Freq: Three times a day (TID) | RESPIRATORY_TRACT | 0 refills | Status: DC

## 2019-10-07 NOTE — Progress Notes (Signed)
Patient's son called in bc they needed more Xopenex. I called it into their pharmacy- [facility] on [address].   Cline Crock PA-C  [facility]

## 2019-10-08 ENCOUNTER — Telehealth: Payer: Self-pay | Admitting: *Deleted

## 2019-10-08 ENCOUNTER — Other Ambulatory Visit: Payer: Medicare Other

## 2019-10-08 ENCOUNTER — Inpatient Hospital Stay: Payer: Medicare Other | Admitting: Nurse Practitioner

## 2019-10-08 NOTE — Telephone Encounter (Signed)
Notified of CBC results from [facility]. Platelets are now normal. Son reports she does not return home till 4/18. Does Dr. Truett Perna need to see her? Per Dr. Truett Perna: She does not need to come to office--counts are good.

## 2019-10-11 ENCOUNTER — Ambulatory Visit (INDEPENDENT_AMBULATORY_CARE_PROVIDER_SITE_OTHER): Payer: Medicare Other | Admitting: Cardiology

## 2019-10-11 ENCOUNTER — Other Ambulatory Visit: Payer: Self-pay

## 2019-10-11 VITALS — BP 138/84 | HR 72 | Ht 59.0 in | Wt 89.0 lb

## 2019-10-11 DIAGNOSIS — I4891 Unspecified atrial fibrillation: Secondary | ICD-10-CM

## 2019-10-11 DIAGNOSIS — J439 Emphysema, unspecified: Secondary | ICD-10-CM

## 2019-10-11 DIAGNOSIS — I422 Other hypertrophic cardiomyopathy: Secondary | ICD-10-CM

## 2019-10-11 DIAGNOSIS — I5031 Acute diastolic (congestive) heart failure: Secondary | ICD-10-CM

## 2019-10-11 DIAGNOSIS — D696 Thrombocytopenia, unspecified: Secondary | ICD-10-CM

## 2019-10-11 MED ORDER — FUROSEMIDE 20 MG PO TABS: ORAL_TABLET | ORAL | 0 refills | Status: DC

## 2019-10-11 NOTE — Progress Notes (Signed)
I would check a BNP and get CXR. Improvement on steroids really goes against HF. Super nice family.   Gerri Spore T. Flora Lipps, MD [facility]  [address], [address] [place] at [place], [place] [address] [phone]  5:54 PM

## 2019-10-11 NOTE — Progress Notes (Signed)
Cardiology Office Note:    Date:  10/11/2019   ID:  MEARA WIECHMAN, DOB 1940-10-01, MRN [id number]  PCP:  System, Provider Not In  Cardiologist:  No primary care provider on file.  Electrophysiologist:  None   Referring MD: No ref. provider found   CC: Increased SOB  History of Present Illness:    Alyssa Nguyen is a 79 y.o. female who lives in [place] who is here visiting her son and daughter in law (both are opthalmologist) with a hx of HOCM, followed by a cardiologist in [place]. The patient presented to the ED with SOB and chest pain 09/27/2019.  Her EKG showed AF with RVR.  Cardioversion was attempted twice in the ED but was unsuccessful.  She was placed on IV Diltiazem and apparently converted spontaneously.  Amiodarone and Toprol 50 mg were added.  It was suspected that an acute respiratory illness prompted her episode of AF. She was negative for COVID but positive for Rhinovirus.  She was gently diuresed from 92.5 lbs to 88.4 lbs and discharged on steroids.  Echo done 09/27/2019 showed vigorous LVF with grade 2 DD, SAM, and an LVOT gradient.   She developed increased SOB when her steroid dose pack ended and this was resumed.  She was just in the office 10/04/2019 and the family felt she was improving. Over the last 24 hours the patient has had increasing SOB and orthopnea.and the family made this appointment,   Past Medical History:  Diagnosis Date  . Hypertrophic cardiomyopathy ([place])   . Idiopathic thrombocytopenic purpura (ITP) (HCC)   . Paroxysmal atrial fibrillation ([facility])   . Pre-diabetes     No past surgical history on file.  Current Medications: Current Meds  Medication Sig  . alendronate (FOSAMAX) 70 MG tablet Take 70 mg by mouth once a week.  Marland Kitchen amiodarone (PACERONE) 200 MG tablet Take 1 tablet (200 mg total) by mouth daily.  . budesonide-formoterol (SYMBICORT) 160-4.5 MCG/ACT inhaler Inhale 2 puffs into the lungs in the morning and at bedtime.  [name] 25 MCG tablet  Take 25 mcg by mouth daily.  Marland Kitchen levalbuterol (XOPENEX) 0.63 MG/3ML nebulizer solution Inhale 3 mLs (0.63 mg total) into the lungs every 8 (eight) hours.  . metoprolol succinate (TOPROL-XL) 50 MG 24 hr tablet Take 0.5 tablets (25 mg total) by mouth daily. (Patient taking differently: Take 50 mg by mouth daily. )  . Multiple Vitamin (DAILY-VITE) TABS Take 1 tablet by mouth daily.  . pantoprazole (PROTONIX) 40 MG tablet Take 1 tablet (40 mg total) by mouth daily.  . predniSONE (STERAPRED UNI-PAK 21 TAB) 10 MG (21) TBPK tablet Take 4 tablets (40 mg total) by mouth daily for 3 days, THEN 2 tablets (20 mg total) daily for 3 days, THEN 1 tablet (10 mg total) daily for 3 days.     Allergies:   Diltiazem   Social History   Socioeconomic History  . Marital status: Widowed    Spouse name: Not on file  . Number of children: Not on file  . Years of education: Not on file  . Highest education level: Not on file  Occupational History  . Not on file  Tobacco Use  . Smoking status: Never Smoker  . Smokeless tobacco: Never Used  Substance and Sexual Activity  . Alcohol use: No  . Drug use: No  . Sexual activity: Not on file  Other Topics Concern  . Not on file  Social History Narrative  . Not on file  Social Determinants of Health   Financial Resource Strain:   . Difficulty of Paying Living Expenses:   Food Insecurity:   . Worried About Programme researcher, broadcasting/film/video in the Last Year:   . Barista in the Last Year:   Transportation Needs:   . Freight forwarder (Medical):   Marland Kitchen Lack of Transportation (Non-Medical):   Physical Activity:   . Days of Exercise per Week:   . Minutes of Exercise per Session:   Stress:   . Feeling of Stress :   Social Connections:   . Frequency of Communication with Friends and Family:   . Frequency of Social Gatherings with Friends and Family:   . Attends Religious Services:   . Active Member of Clubs or Organizations:   . Attends Banker  Meetings:   Marland Kitchen Marital Status:      Family History: The patient's family history is not on file.  ROS:   Please see the history of present illness.     All other systems reviewed and are negative.  EKGs/Labs/Other Studies Reviewed:    The following studies were reviewed today: Echo 09/27/2019  EKG:  EKG is ordered today.  The ekg ordered today demonstrates NSR, HR 72, LVH, PACs.  Recent Labs: 09/26/2019: B Natriuretic Peptide 479.0 09/27/2019: Magnesium 2.8; TSH 3.571 09/28/2019: ALT 31; BUN 9; Creatinine, Ser 0.58; Potassium 4.9; Sodium 137 10/05/2019: Hemoglobin 14.6; Platelets 186  Recent Lipid Panel No results found for: CHOL, TRIG, HDL, CHOLHDL, VLDL, LDLCALC, LDLDIRECT  Physical Exam:    VS:  BP 138/84   Pulse 72   Ht 4\' 11"  (1.499 m)   Wt 89 lb (40.4 kg)   BMI 17.98 kg/m     Wt Readings from Last 3 Encounters:  10/11/19 89 lb (40.4 kg)  10/04/19 88 lb 6.4 oz (40.1 kg)  09/28/19 92 lb 9.5 oz (42 kg)     GEN: Thin female,well developed mild SOB at rest HEENT: Normal NECK: elevated JCD No carotid bruits CARDIAC: RRR, 2/6 systolic murmur LSB, no rubs, gallops RESPIRATORY:  Bi-basilar rales and diffuse expiratory wheezing  ABDOMEN: Soft, non-tender, non-distended MUSCULOSKELETAL:  No edema; No deformity  SKIN: Warm and dry NEUROLOGIC:  Alert and oriented x 3 PSYCHIATRIC:  Normal affect   ASSESSMENT:    Acute CHF-  I think the patient is volume overloaded though its difficult to tell secondary to cachexia.  I suggested a couple of doses of Lasix 40 mg then decrease to 20 mg.  The family has a way to check her B/P at home and knows to hold her Lasix if her B/P is less than 100 systolic.  Acute respiratory failure- I would continue prednisone 10 mg daily till we see her in follow up. The family has already decreased the Toprol to 25 mg which I agree with.   AF with RVR- Holding NSR- Amiodarone has already been decreased to 200 mg daily.   Thrombocytopenia- H/O  ITP- not felt to be a candidate for anticoagulation.   Asthmatic COPD-    PLAN:    Lasix 40 mg daily x 3 days then 20 mg daily until she is seen next week. Hold Lasix if her B/P goes below 100 systolic.  Continue prednisone 10 mg daily. The patient's daughter in indicated they will keep the patient here in [place] till stable. I will call the patient tomorrow and check on her.    Medication Adjustments/Labs and Tests Ordered: Current medicines are reviewed at  length with the patient today.  Concerns regarding medicines are outlined above.  No orders of the defined types were placed in this encounter.  No orders of the defined types were placed in this encounter.   There are no Patient Instructions on file for this visit.   Signed, Corine Shelter, PA-C  10/11/2019 4:05 PM    [facility]

## 2019-10-11 NOTE — Patient Instructions (Signed)
Medication Instructions:   START Furosemide 20 mg--take 40 mg (2 tab) daily for three days. Then, decrease to 20 mg (1 tab) daily. If your blood pressure drops below 100 before the end of the 3 days, go ahead and decrease to 20 mg.  Continue Prednisone 10 mg--1 tab daily.  *If you need a refill on your cardiac medications before your next appointment, please call your pharmacy*   Follow-Up: At [facility], you and your health needs are our priority.  As part of our continuing mission to provide you with exceptional heart care, we have created designated Provider Care Teams.  These Care Teams include your primary Cardiologist (physician) and Advanced Practice Providers (APPs -  Physician Assistants and Nurse Practitioners) who all work together to provide you with the care you need, when you need it.  We recommend signing up for the patient portal called "MyChart".  Sign up information is provided on this After Visit Summary.  MyChart is used to connect with patients for Virtual Visits (Telemedicine).  Patients are able to view lab/test results, encounter notes, upcoming appointments, etc.  Non-urgent messages can be sent to your provider as well.   To learn more about what you can do with MyChart, go to [email or web address].    Your next appointment:   Thursday, 10/18/19 at 3:45 PM.  The format for your next appointment:   In Person  Provider:   Corine Shelter, PA----Dr. Flora Lipps in office.

## 2019-10-12 ENCOUNTER — Telehealth: Payer: Self-pay | Admitting: Cardiology

## 2019-10-12 NOTE — Telephone Encounter (Signed)
After review by Dr Carmon Ginsberg I called and left a message for the patient to only take Lasix 40 mg for two doses- then discontinue.  If she is improved- keep f/u 4/22, if not consider going to back to the ED for further evaluation.  Corine Shelter PA-C 10/12/2019 12:46 PM

## 2019-10-16 ENCOUNTER — Telehealth: Payer: Self-pay | Admitting: Cardiology

## 2019-10-16 NOTE — Telephone Encounter (Signed)
I called to see how Alyssa Nguyen was doing and to see if they could possibly come in earlier on the 22nd- I told her anytime on the 22nd would work.  Corine Shelter PA-C 10/16/2019 8:22 AM

## 2019-10-18 ENCOUNTER — Ambulatory Visit (INDEPENDENT_AMBULATORY_CARE_PROVIDER_SITE_OTHER): Payer: Medicare Other | Admitting: Physician Assistant

## 2019-10-18 ENCOUNTER — Encounter: Payer: Self-pay | Admitting: Physician Assistant

## 2019-10-18 ENCOUNTER — Ambulatory Visit: Payer: Medicare Other | Admitting: Cardiology

## 2019-10-18 ENCOUNTER — Other Ambulatory Visit: Payer: Self-pay

## 2019-10-18 VITALS — BP 120/60 | HR 64 | Ht <= 58 in | Wt 91.2 lb

## 2019-10-18 DIAGNOSIS — R0602 Shortness of breath: Secondary | ICD-10-CM

## 2019-10-18 DIAGNOSIS — I421 Obstructive hypertrophic cardiomyopathy: Secondary | ICD-10-CM

## 2019-10-18 DIAGNOSIS — I480 Paroxysmal atrial fibrillation: Secondary | ICD-10-CM | POA: Diagnosis not present

## 2019-10-18 DIAGNOSIS — R7303 Prediabetes: Secondary | ICD-10-CM

## 2019-10-18 DIAGNOSIS — Z862 Personal history of diseases of the blood and blood-forming organs and certain disorders involving the immune mechanism: Secondary | ICD-10-CM

## 2019-10-18 DIAGNOSIS — J439 Emphysema, unspecified: Secondary | ICD-10-CM

## 2019-10-18 DIAGNOSIS — E039 Hypothyroidism, unspecified: Secondary | ICD-10-CM

## 2019-10-18 MED ORDER — FUROSEMIDE 20 MG PO TABS: ORAL_TABLET | ORAL | 3 refills | Status: AC

## 2019-10-18 NOTE — Progress Notes (Deleted)
Cardiology Office Note:    Date:  10/18/2019   ID:  Alyssa Nguyen, DOB [birth year], MRN [id number]  PCP:  System, Provider Not In  Cardiologist:  Dr Farris Has in [facility], she has a cardiologist at home in [place]  Electrophysiologist:  None   Referring MD: No ref. provider found   CC: SOB  History of Present Illness:    Alyssa Nguyen is a pleasant 79 y.o. female who lives in [place] who is here visiting her son and daughter in law (both are opthalmologist) with a hx of HOCM.  She is followed by a cardiologist in [place]. The patient presented to the ED with SOB and chest pain 09/27/2019. Echo done 09/27/2019 showed vigorous LVF with grade 2 DD, SAM, and an LVOT gradient of 4 m/s.  Her EKG showed AF with RVR.  Cardioversion was attempted twice in the ED but was unsuccessful.  She was placed on IV Diltiazem and apparently converted spontaneously though she did have hypotension with Diltiazem.  Amiodarone and Toprol 50 mg were added.  It was suspected that an acute respiratory illness prompted her episode of AF. She was negative for COVID but positive for Rhinovirus.  She has thrombocytopenia and anticoagulation was felt to be contraindicated.  She was seen by Dr Benay Spice and received a course of Decadron while hospitalized and her platelet count went from 25k to 186k.  On CXR she had edema. She was gently diuresed from 92.5 lbs to 88.4 lbs and discharged with plans for her to have close follow up with her PCP and cardiologist in [place].    She was seen in the office on 10/04/2019.  The patients son called 10/02/2019 saying his mother had wheezing and increased SOB. Dr Farris Has ordered a steroid taper. On f/u 10/04/2019 she was improving and instructed to continue the steroid taper.  On 10/11/2019 she was added to my schedule to be seen for increasing SOB after her steroid taper ended.  She also had developed orthopnea and the family felt like she was fluid overloaded. On exam she bi basilar rales as  well as expiratory wheezing.  I suggested two doses of lasix 40 mg and to resume the Prednisone at 10 mg daily. She returns today for follow up.   Past Medical History:  Diagnosis Date  . Hypertrophic cardiomyopathy ([place])   . Idiopathic thrombocytopenic purpura (ITP) (HCC)   . Paroxysmal atrial fibrillation (HCC)   . Pre-diabetes     No past surgical history on file.  Current Medications: No outpatient medications have been marked as taking for the 10/18/19 encounter (Appointment) with Erlene Quan, PA-C.     Allergies:   Diltiazem   Social History   Socioeconomic History  . Marital status: Widowed    Spouse name: Not on file  . Number of children: Not on file  . Years of education: Not on file  . Highest education level: Not on file  Occupational History  . Not on file  Tobacco Use  . Smoking status: Never Smoker  . Smokeless tobacco: Never Used  Substance and Sexual Activity  . Alcohol use: No  . Drug use: No  . Sexual activity: Not on file  Other Topics Concern  . Not on file  Social History Narrative  . Not on file   Social Determinants of Health   Financial Resource Strain:   . Difficulty of Paying Living Expenses:   Food Insecurity:   . Worried About Charity fundraiser in  the Last Year:   . Ran Out of Food in the Last Year:   Transportation Needs:   . Freight forwarder (Medical):   Marland Kitchen Lack of Transportation (Non-Medical):   Physical Activity:   . Days of Exercise per Week:   . Minutes of Exercise per Session:   Stress:   . Feeling of Stress :   Social Connections:   . Frequency of Communication with Friends and Family:   . Frequency of Social Gatherings with Friends and Family:   . Attends Religious Services:   . Active Member of Clubs or Organizations:   . Attends Banker Meetings:   Marland Kitchen Marital Status:      Family History: The patient's family history is not on file.  ROS:   Please see the history of present illness.      All other systems reviewed and are negative.  EKGs/Labs/Other Studies Reviewed:    The following studies were reviewed today: Echo 4/-06/2019- FINDINGS  Left Ventricle: Left ventricular ejection fraction, by estimation, is  >75%. The left ventricle has hyperdynamic function. The left ventricle has  no regional wall motion abnormalities. The left ventricular internal  cavity size was normal in size. There  is moderate left ventricular hypertrophy. Left ventricular diastolic  parameters are consistent with Grade II diastolic dysfunction  (pseudonormalization). Elevated left atrial pressure.   EKG:  EKG is *** ordered today.  The ekg ordered today demonstrates ***  Recent Labs: 09/26/2019: B Natriuretic Peptide 479.0 09/27/2019: Magnesium 2.8; TSH 3.571 09/28/2019: ALT 31; BUN 9; Creatinine, Ser 0.58; Potassium 4.9; Sodium 137 10/05/2019: Hemoglobin 14.6; Platelets 186  Recent Lipid Panel No results found for: CHOL, TRIG, HDL, CHOLHDL, VLDL, LDLCALC, LDLDIRECT  Physical Exam:    VS:  There were no vitals taken for this visit.    Wt Readings from Last 3 Encounters:  10/11/19 89 lb (40.4 kg)  10/04/19 88 lb 6.4 oz (40.1 kg)  09/28/19 92 lb 9.5 oz (42 kg)     GEN: Cachectic female, well developed in no acute distress HEENT: Normal NECK: No JVD; No carotid bruits LYMPHATICS: No lymphadenopathy CARDIAC: RRR, 2/6 systolic murmur rubs, gallops RESPIRATORY:   ABDOMEN: Soft, non-tender, non-distended MUSCULOSKELETAL:  No edema; No deformity  SKIN: Warm and dry NEUROLOGIC:  Alert and oriented x 3 PSYCHIATRIC:  Normal affect   ASSESSMENT:    Acute CHF-  I think the patient is volume overloaded though its difficult to tell secondary to cachexia.  I suggested a couple of doses of Lasix 40 mg then decrease to 20 mg.  The family has a way to check her B/P at home and knows to hold her Lasix if her B/P is less than 100 systolic.  Acute respiratory failure- I would continue prednisone 10  mg daily till we see her in follow up. The family has already decreased the Toprol to 25 mg which I agree with.   AF with RVR- Holding NSR- Amiodarone has already been decreased to 200 mg daily.   Thrombocytopenia- H/O ITP- not felt to be a candidate for anticoagulation.   Asthmatic COPD- On inhalers  PLAN:    In order of problems listed above:  1. ***   Medication Adjustments/Labs and Tests Ordered: Current medicines are reviewed at length with the patient today.  Concerns regarding medicines are outlined above.  No orders of the defined types were placed in this encounter.  No orders of the defined types were placed in this encounter.   There  are no Patient Instructions on file for this visit.   Jolene Provost, PA-C  10/18/2019 7:47 AM    [facility]

## 2019-10-18 NOTE — Patient Instructions (Signed)
Medication Instructions:  TAKE FUROSEMIDE 20 MG ONCE DAILY AS NEEDED FOR SHORTNESS OF BREATH  *If you need a refill on your cardiac medications before your next appointment, please call your pharmacy*   Lab Work: Your physician recommends that you HAVE LAB WORK TODAY  If you have labs (blood work) drawn today and your tests are completely normal, you will receive your results only by: Marland Kitchen MyChart Message (if you have MyChart) OR . A paper copy in the mail If you have any lab test that is abnormal or we need to change your treatment, we will call you to review the results.  Follow-Up: At [facility], you and your health needs are our priority.  As part of our continuing mission to provide you with exceptional heart care, we have created designated Provider Care Teams.  These Care Teams include your primary Cardiologist (physician) and Advanced Practice Providers (APPs -  Physician Assistants and Nurse Practitioners) who all work together to provide you with the care you need, when you need it.  We recommend signing up for the patient portal called "MyChart".  Sign up information is provided on this After Visit Summary.  MyChart is used to connect with patients for Virtual Visits (Telemedicine).  Patients are able to view lab/test results, encounter notes, upcoming appointments, etc.  Non-urgent messages can be sent to your provider as well.   To learn more about what you can do with MyChart, go to [email or web address].    Your next appointment:   AS NEEDED

## 2019-10-18 NOTE — Progress Notes (Signed)
Cardiology Office Note:    Date:  10/20/2019   ID:  Alyssa Nguyen, DOB [birth year], MRN [id number]  PCP:  System, Provider Not In  Cardiologist:  Dr. Alexander Bergeron in [place]; seen by Dr. Flora Lipps during recent hospitalization Electrophysiologist:  None   Referring MD: No ref. provider found   Chief Complaint  Patient presents with  . Follow-up    seen with Dr. Flora Lipps    History of Present Illness:    Alyssa Nguyen is a 79 y.o. female with a hx of hypertrophic cardiomyopathy (suspected basal septal variant), ITP, PAF, hypothyroidism, emphysema and prediabetes.  She is here visiting her son and daughter-in-law, both of whom ophthalmologist.  She was followed by a cardiologist in [place].  Patient presented to the ED with shortness of breath and bilateral shoulder discomfort on 09/27/2019.  EKG demonstrated atrial fibrillation with RVR.  Cardioversion was attempted twice in the ED however was unsuccessful.  She was placed on IV Cardizem and converted spontaneously.  Amiodarone and Toprol-XL were added.  Due to ITP, she was consider not a candidate for anticoagulation at this time.  She was also treated for acute respiratory failure during the same admission which likely exacerbated her atrial fibrillation.  She was tested positive for rhinovirus.  She did have elevated troponin that peaked at 3400 which was felt to be demand ischemia in the setting of A. fib with RVR and a viral infection.  Echocardiogram performed on 09/27/2019 showed vigorous LV function, grade 2 DD, chordal S.A.M., and elevated LV gradient.  Since discharge, she has developed increasing shortness of breath, she was treated with a course of steroid taper by Dr. Flora Lipps again early April.  She was seen by Corine Shelter again on 10/11/2019 for dyspnea.  She was placed on 2 days of 40 mg Lasix then discontinue.  Her amiodarone has already been decreased to 200 mg daily.  Patient presents today accompanied by her son, Dr. Carmela Nguyen. Her  dyspnea has significantly improved and she has been able to ambulate longer without SOB. Her son noticed she still has some wheezing yesterday. Patient was examined along with Dr. Flora Lipps, we recommended PRN lasix given her advanced age, small body size and h/o HOCM.  Otherwise she appears to be euvolemic on exam without JVD or leg edema. She does have mild crackles in the base on the lung which I suspect is related to atelectasis seen on the previous CXR. Dr. Flora Lipps did recommend a CXR, however since she is going back to [place] this weekend and she is doing well, we agreed that this can be done by her PCP early next week. Otherwise, she will need early cardiology visit in 1-2 weeks.     Past Medical History:  Diagnosis Date  . Hypertrophic cardiomyopathy (HCC)   . Idiopathic thrombocytopenic purpura (ITP) (HCC)   . Paroxysmal atrial fibrillation (HCC)   . Pre-diabetes     History reviewed. No pertinent surgical history.  Current Medications: No outpatient medications have been marked as taking for the 10/18/19 encounter (Office Visit) with Azalee Course, PA.     Allergies:   Diltiazem   Social History   Socioeconomic History  . Marital status: Widowed    Spouse name: Not on file  . Number of children: Not on file  . Years of education: Not on file  . Highest education level: Not on file  Occupational History  . Not on file  Tobacco Use  . Smoking status: Never Smoker  .  Smokeless tobacco: Never Used  Substance and Sexual Activity  . Alcohol use: No  . Drug use: No  . Sexual activity: Not on file  Other Topics Concern  . Not on file  Social History Narrative  . Not on file   Social Determinants of Health   Financial Resource Strain:   . Difficulty of Paying Living Expenses:   Food Insecurity:   . Worried About Programme researcher, broadcasting/film/video in the Last Year:   . Barista in the Last Year:   Transportation Needs:   . Freight forwarder (Medical):   Marland Kitchen Lack of Transportation  (Non-Medical):   Physical Activity:   . Days of Exercise per Week:   . Minutes of Exercise per Session:   Stress:   . Feeling of Stress :   Social Connections:   . Frequency of Communication with Friends and Family:   . Frequency of Social Gatherings with Friends and Family:   . Attends Religious Services:   . Active Member of Clubs or Organizations:   . Attends Banker Meetings:   Marland Kitchen Marital Status:      Family History: The patient's family history is not on file.  ROS:   Please see the history of present illness.     All other systems reviewed and are negative.  EKGs/Labs/Other Studies Reviewed:    The following studies were reviewed today:  Echo 09/27/2019 1. Vigorous LV systolic function; moderate LVH with proximal septal  thickening; grade 2 diastolic dysfunction; chordal SAM with elevated LVOT  gradient of 4 m/S (findings c/w HOCM physiology); trace AI; moderate MR;  mild LAE; mild TR.  2. Left ventricular ejection fraction, by estimation, is >75%. The left  ventricle has hyperdynamic function. The left ventricle has no regional  wall motion abnormalities. There is moderate left ventricular hypertrophy.  Left ventricular diastolic  parameters are consistent with Grade II diastolic dysfunction  (pseudonormalization). Elevated left atrial pressure.  3. Right ventricular systolic function is normal. The right ventricular  size is normal. There is mildly elevated pulmonary artery systolic  pressure.  4. Left atrial size was mildly dilated.  5. The mitral valve is normal in structure. Moderate mitral valve  regurgitation. No evidence of mitral stenosis.  6. The aortic valve is tricuspid. Aortic valve regurgitation is trivial.  Mild aortic valve sclerosis is present, with no evidence of aortic valve  stenosis.  7. The inferior vena cava is normal in size with greater than 50%  respiratory variability, suggesting right atrial pressure of 3 mmHg.   EKG:   EKG is ordered today.  The ekg ordered today demonstrates sinus rhythm with PACs and LVH  Recent Labs: 09/26/2019: B Natriuretic Peptide 479.0 09/27/2019: Magnesium 2.8; TSH 3.571 09/28/2019: ALT 31; BUN 9; Creatinine, Ser 0.58; Potassium 4.9; Sodium 137 10/05/2019: Hemoglobin 14.6; Platelets 186 10/18/2019: NT-Pro BNP 1,806  Recent Lipid Panel No results found for: CHOL, TRIG, HDL, CHOLHDL, VLDL, LDLCALC, LDLDIRECT  Physical Exam:    VS:  BP 120/60   Pulse 64   Ht 4\' 8"  (1.422 m)   Wt 91 lb 3.2 oz (41.4 kg)   BMI 20.45 kg/m     Wt Readings from Last 3 Encounters:  10/18/19 91 lb 3.2 oz (41.4 kg)  10/11/19 89 lb (40.4 kg)  10/04/19 88 lb 6.4 oz (40.1 kg)     GEN:  Well nourished, well developed in no acute distress HEENT: Normal NECK: No JVD; No carotid bruits LYMPHATICS: No  lymphadenopathy CARDIAC: RRR, no murmurs, rubs, gallops RESPIRATORY:  Clear to auscultation without rales, wheezing or rhonchi  ABDOMEN: Soft, non-tender, non-distended MUSCULOSKELETAL:  No edema; No deformity  SKIN: Warm and dry NEUROLOGIC:  Alert and oriented x 3 PSYCHIATRIC:  Normal affect   ASSESSMENT:    1. Shortness of breath   2. Paroxysmal atrial fibrillation (HCC)   3. HOCM (hypertrophic obstructive cardiomyopathy) ([place])   4. History of ITP   5. Hypothyroidism, unspecified type   6. Pre-diabetes   7. Pulmonary emphysema, unspecified emphysema type ([place])    PLAN:    In order of problems listed above:  1. Shortness of breath: significant improvement after 5 days of diuresis. Her son still noticing some wheezing yesterday, however on exam, she appears to be euvolemic. She does have crackles in the base of the lung, however I suspect this is more atelectasis. There is no JVD or leg edema. Patient was seen along with Dr. Audie Box who recommended PRN lasix given her advanced age, small body size and h/o HOCM with increased LVOT gradient which make her dependent on the preload. At the advise of Dr.  Audie Box, will obtain BNP and also CXR. Since she is going back to [place] this weekend, CXR can be obtained at her PCP's office early next week.  2. PAF: maintaining NSR. She was converted on rate control therapy recently. Continue low dose amiodarone therapy and metoprolol. She is not a candidate for anticoagulation therapy given h/o ITP and advanced age. Suspect her recent afib was driven by Rhino virus infection, will defer to her primary cardiologist in [place] to decide if they want her to do a trial off of amiodarone later since since she has a h/o pulmonary emphysema which further warrant limiting any medication that has pulmonary toxicity such as amiodarone.  3. HOCM: see echocardiogram report above, she does have increased LVOT gradient. Will need to be very careful with diuretic as she is dependent on the preload.   4. H/o ITP: platelet level stable  5. Hypothyroidism: on levothyroxine  6. Prediabetes: followed by PCP  7. Pulmonary emphysema: stable, no sign of exaberbation, however she likely will always have some degree of DOE. Xopenex as needed.    Medication Adjustments/Labs and Tests Ordered: Current medicines are reviewed at length with the patient today.  Concerns regarding medicines are outlined above.  Orders Placed This Encounter  Procedures  . Pro b natriuretic peptide (BNP)[facility])  . EKG 12-Lead   Meds ordered this encounter  Medications  . furosemide (LASIX) 20 MG tablet    Sig: 1 TABLET DAILY AS NEEDED FOR SHORTNESS OF BREATH    Dispense:  90 tablet    Refill:  3    Patient Instructions  Medication Instructions:  TAKE FUROSEMIDE 20 MG ONCE DAILY AS NEEDED FOR SHORTNESS OF BREATH  *If you need a refill on your cardiac medications before your next appointment, please call your pharmacy*   Lab Work: Your physician recommends that you HAVE LAB WORK TODAY  If you have labs (blood work) drawn today and your tests are completely  normal, you will receive your results only by: Marland Kitchen MyChart Message (if you have MyChart) OR . A paper copy in the mail If you have any lab test that is abnormal or we need to change your treatment, we will call you to review the results.  Follow-Up: At [facility], you and your health needs are our priority.  As part of our continuing mission  to provide you with exceptional heart care, we have created designated Provider Care Teams.  These Care Teams include your primary Cardiologist (physician) and Advanced Practice Providers (APPs -  Physician Assistants and Nurse Practitioners) who all work together to provide you with the care you need, when you need it.  We recommend signing up for the patient portal called "MyChart".  Sign up information is provided on this After Visit Summary.  MyChart is used to connect with patients for Virtual Visits (Telemedicine).  Patients are able to view lab/test results, encounter notes, upcoming appointments, etc.  Non-urgent messages can be sent to your provider as well.   To learn more about what you can do with MyChart, go to [email or web address].    Your next appointment:   AS NEEDED    Signed, Azalee Course, PA  10/20/2019 10:28 PM    [facility]

## 2019-10-19 LAB — PRO B NATRIURETIC PEPTIDE: NT-Pro BNP: 1806 pg/mL — ABNORMAL HIGH (ref 0–738)

## 2019-10-20 ENCOUNTER — Encounter: Payer: Self-pay | Admitting: Physician Assistant

## 2019-10-20 NOTE — Progress Notes (Signed)
ProBNP still is somewhat elevated, normal range is <738, her level is 1806. Will discuss with her phone in the morning. For now, I think PRN lasix is still the way to go given her advanced age, small body size and h/o HOCM.

## 2019-10-20 NOTE — Progress Notes (Signed)
Suspect her BNP or proBNP may always be a little elevated as previous research indicate positive correlation between LVOT obstruction and elevation of proBNP. I recommended continue with the plan for PRN lasix

## 2020-08-15 IMAGING — DX DG CHEST 1V PORT
1 series · 1 of 1 positions shown · non-contrast
Comparison: 09/26/2019

CLINICAL DATA: Pulmonary edema

EXAM:
PORTABLE CHEST 1 VIEW

[chest ap]
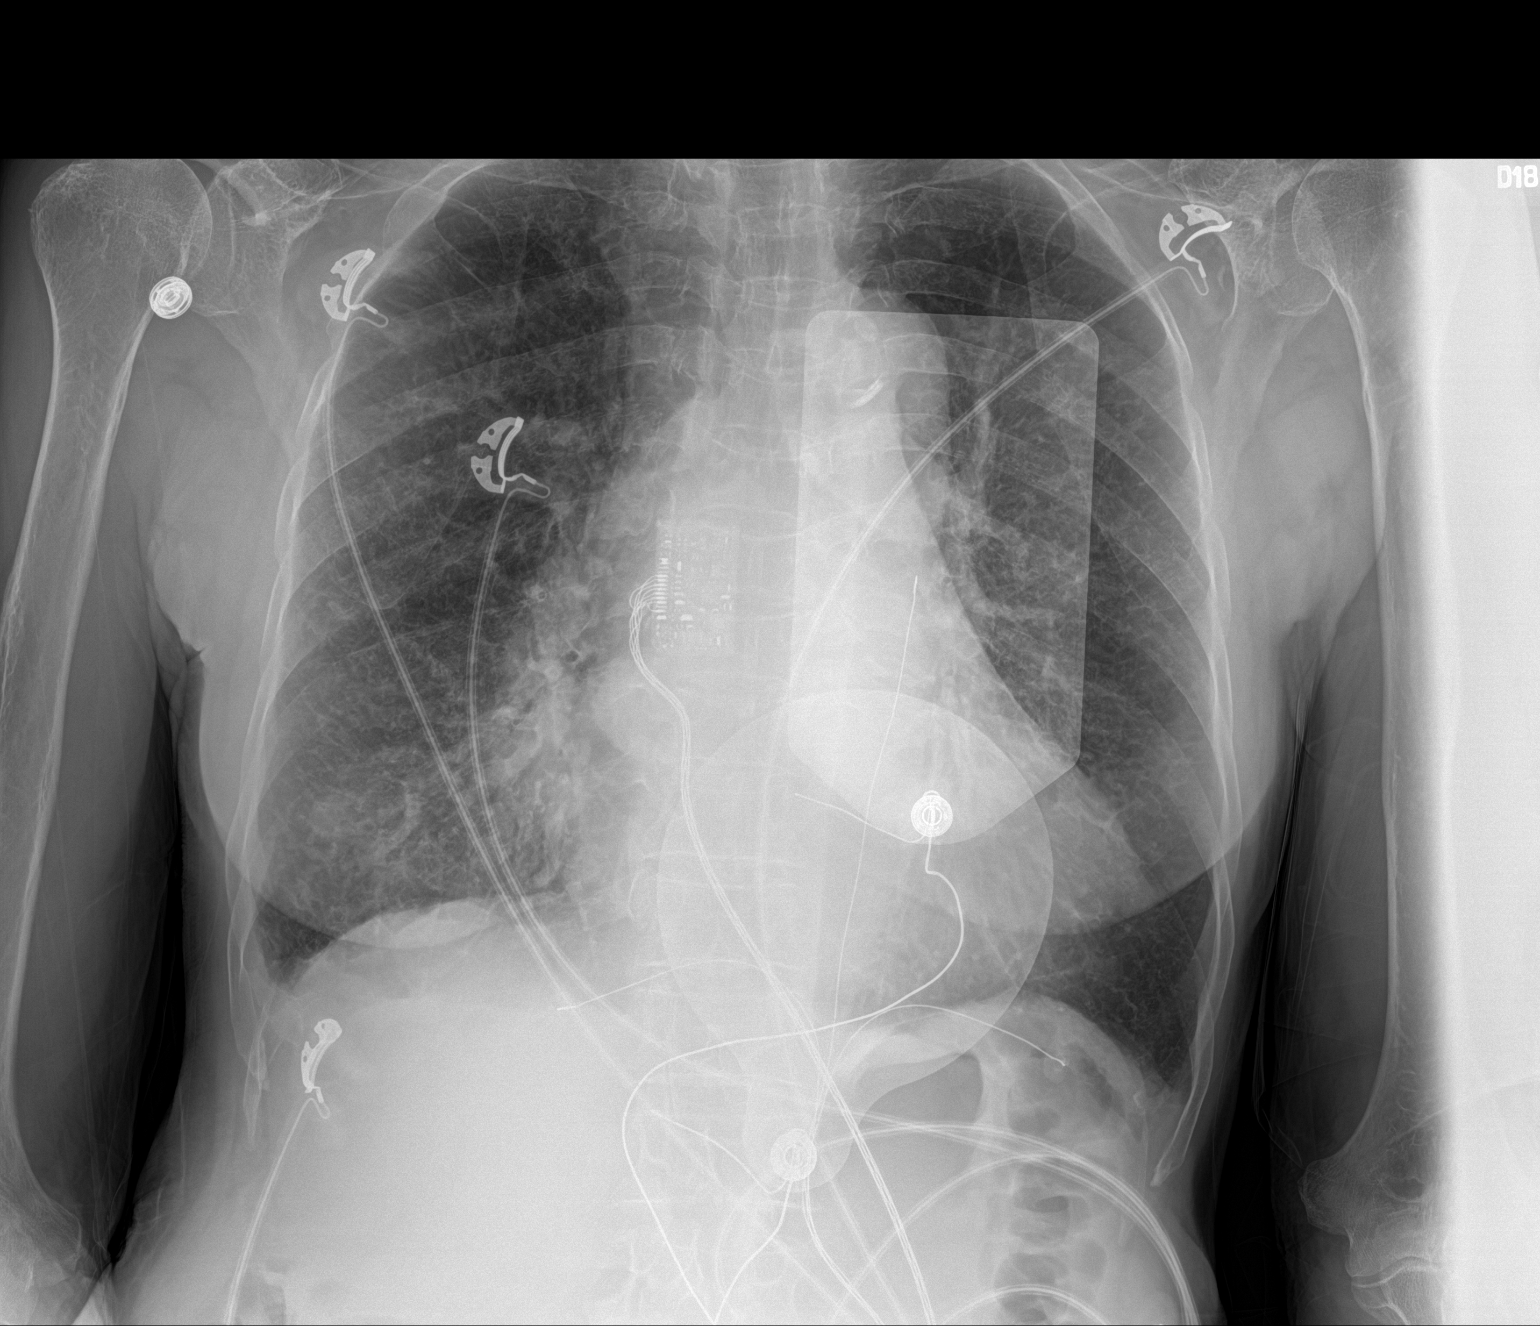

[1 of 1 positions shown; findings below may reference images not displayed]

FINDINGS: External pacer paddles are noted.

The cardiac silhouette, mediastinal and hilar contours are within
normal limits and stable.

The lungs show improved aeration with resolving edema and
atelectasis. No definite pleural effusions. No pneumothorax.
IMPRESSION: Improving lung aeration with resolving edema and atelectasis.

## 2020-11-21 ENCOUNTER — Other Ambulatory Visit: Payer: Self-pay

## 2020-11-21 ENCOUNTER — Telehealth: Payer: Self-pay | Admitting: Cardiovascular Disease

## 2020-11-21 NOTE — Telephone Encounter (Signed)
Let's overbook her somewhere next week. She needs an EKG to make sure her Afib is not back.  Alyssa Nguyen T. Flora Lipps, MD, [facility]  [facility]  [address] [place], [place] [address] [phone]  1:10 PM

## 2020-11-21 NOTE — Telephone Encounter (Signed)
Called patient son- offered appointment for next week to see Dr.O'Neal.  Son verbalized understanding 11/26/2020 at 3:20 PM

## 2020-11-21 NOTE — Telephone Encounter (Signed)
Patient's son would like for Dr. Flora Lipps or nurse to give him a call regard the patient

## 2020-11-21 NOTE — Telephone Encounter (Signed)
Returned call to Dr Narendra(Nick) Allena Katz, pt's son, he states that pt is very SOB, she has no edema/pitting, no wheezing, he does not think that she is volume overloaded, he does not think that lasix would help. She is very fatigued. He states that Dr Flora Lipps has mentioned in the past that he might taper her off the Amiodarone in the past and he thinks that this would be a good time to do this and  help the patient because Son thinks that "her heart just cant keep up with her".  Pt is returning to [place], [place] soon 11-30-20. Son notified that Dr Flora Lipps is not in today, he states if Dr Flora Lipps "could, sometime next week, just give him a few minutes before his lunch or after he is done seeing patients" he would very much appreciate this. If a better time is good for you just text him and he will call or make sure that he is available.

## 2020-11-24 NOTE — Progress Notes (Addendum)
Cardiology Office Note:   Date:  11/26/2020  NAME:  Alyssa Nguyen    MRN: [id number] DOB:  11-Nov-1940   PCP:  System, Provider Not In  Cardiologist:  None  Electrophysiologist:  None   Referring MD: No ref. provider found   Chief Complaint  Patient presents with  . Follow-up         History of Present Illness:   Alyssa Nguyen is a 80 y.o. female with a hx of HOCM, Afib, asthma who presents for follow-up.  She presents with her daughter-in-law.  She is also an ophthalmologist in town.  Since [name] has been visiting in [place] she has been short of breath with exertion.  She has been increasing her dose of Lasix which has not helped.  She presents today with expiratory wheezing.  She has clear evidence of asthma on examination.  She has no evidence of increased volume overload.  She does have a 3 out of 6 systolic ejection murmur.  I suspect her outflow tract obstruction is worse in the setting of dehydration.  I recommended she increase her volume.  She denies any chest pain.  EKG shows she is maintaining sinus rhythm on amiodarone.  She is also on Eliquis.  Denies any chest pain.  Noticeably short of breath on exam.  Problem List 1. Hypertrophic Cardiomyopathy, sigmoid variant  -no LGE 2. Persistent Afib -Dx 09/2019 -on amiodarone  3. Chronic Obstructive Asthma   Past Medical History: Past Medical History:  Diagnosis Date  . Hypertrophic cardiomyopathy (HCC)   . Idiopathic thrombocytopenic purpura (ITP) (HCC)   . Paroxysmal atrial fibrillation (HCC)   . Pre-diabetes     Past Surgical History: History reviewed. No pertinent surgical history.  Current Medications: Current Meds  Medication Sig  . alendronate (FOSAMAX) 70 MG tablet Take 70 mg by mouth once a week.  . budesonide-formoterol (SYMBICORT) 160-4.5 MCG/ACT inhaler Inhale 2 puffs into the lungs in the morning and at bedtime.  Janann August 25 MCG tablet Take 25 mcg by mouth daily.  . furosemide (LASIX) 20 MG  tablet 1 TABLET DAILY AS NEEDED FOR SHORTNESS OF BREATH  . levalbuterol (XOPENEX) 0.63 MG/3ML nebulizer solution Inhale 3 mLs (0.63 mg total) into the lungs every 8 (eight) hours.  . metoprolol succinate (TOPROL-XL) 50 MG 24 hr tablet Take 0.5 tablets (25 mg total) by mouth daily. (Patient taking differently: Take 50 mg by mouth daily.)  . Multiple Vitamin (DAILY-VITE) TABS Take 1 tablet by mouth daily.  . predniSONE (STERAPRED UNI-PAK 21 TAB) 10 MG (21) TBPK tablet Take 4 tablets (40 mg) for 2 days, then 3 tablets (30 mg) for 2 days, then 2 tablets (20 mg ) for 2 days, then 1 tablet (10 mg) for 2 days, then stop     Allergies:    Diltiazem   Social History: Social History   Socioeconomic History  . Marital status: Widowed    Spouse name: Not on file  . Number of children: Not on file  . Years of education: Not on file  . Highest education level: Not on file  Occupational History  . Not on file  Tobacco Use  . Smoking status: Never Smoker  . Smokeless tobacco: Never Used  Vaping Use  . Vaping Use: Never used  Substance and Sexual Activity  . Alcohol use: No  . Drug use: No  . Sexual activity: Not on file  Other Topics Concern  . Not on file  Social History Narrative  .  Not on file   Social Determinants of Health   Financial Resource Strain: Not on file  Food Insecurity: Not on file  Transportation Needs: Not on file  Physical Activity: Not on file  Stress: Not on file  Social Connections: Not on file     Family History: The patient's family history is not on file.  ROS:   All other ROS reviewed and negative. Pertinent positives noted in the HPI.     EKGs/Labs/Other Studies Reviewed:   The following studies were personally reviewed by me today:  EKG:  EKG is ordered today.  The ekg ordered today demonstrates normal sinus rhythm heart rate 62, no acute ischemic changes or evidence of infarction, and was personally reviewed by me.   CMR 12/06/2018 Archie Balboa,  [place])  IMPRESSION:  1. Normal left ventricular size and function with LV ejection fraction calculated at 71%. There are no regional wall motion abnormalities.  2. Normal right ventricular size and function with RV ejection fraction calculated at 65%.  3. There is severe hypertrophy of the basal anteroseptal wall which measures up to 17 mm. There is systolic anterior motion (SAM) of the mitral valve seen.  4. There is no evidence of abnormal delayed enhancement in the left ventricle to suggest fibrous changes / scarring. However some of the basal slices are missing from the delayed enhancement series.    TTE 09/27/2019 1. Vigorous LV systolic function; moderate LVH with proximal septal  thickening; grade 2 diastolic dysfunction; chordal SAM with elevated LVOT  gradient of 4 m/S (findings c/w HOCM physiology); trace AI; moderate MR;  mild LAE; mild TR.  2. Left ventricular ejection fraction, by estimation, is >75%. The left  ventricle has hyperdynamic function. The left ventricle has no regional  wall motion abnormalities. There is moderate left ventricular hypertrophy.  Left ventricular diastolic  parameters are consistent with Grade II diastolic dysfunction  (pseudonormalization). Elevated left atrial pressure.  3. Right ventricular systolic function is normal. The right ventricular  size is normal. There is mildly elevated pulmonary artery systolic  pressure.  4. Left atrial size was mildly dilated.  5. The mitral valve is normal in structure. Moderate mitral valve  regurgitation. No evidence of mitral stenosis.  6. The aortic valve is tricuspid. Aortic valve regurgitation is trivial.  Mild aortic valve sclerosis is present, with no evidence of aortic valve  stenosis.  7. The inferior vena cava is normal in size with greater than 50%  respiratory variability, suggesting right atrial pressure of 3 mmHg.   Recent Labs: No results found for requested labs within last 8760  hours.   Recent Lipid Panel No results found for: CHOL, TRIG, HDL, CHOLHDL, VLDL, LDLCALC, LDLDIRECT  Physical Exam:   VS:  BP 108/60 (BP Location: Right Arm, Patient Position: Sitting, Cuff Size: Normal)   Pulse 62   Ht 4\' 9"  (1.448 m)   Wt 93 lb 14.4 oz (42.6 kg)   SpO2 95%   BMI 20.32 kg/m    Wt Readings from Last 3 Encounters:  11/26/20 93 lb 14.4 oz (42.6 kg)  10/18/19 91 lb 3.2 oz (41.4 kg)  10/11/19 89 lb (40.4 kg)    General: Well nourished, well developed, in no acute distress Head: Atraumatic, normal size  Eyes: PEERLA, EOMI  Neck: Supple, no JVD Endocrine: No thryomegaly Cardiac: Normal S1, S2; RRR; 3 out of 6 systolic ejection murmur Lungs: Poor airway movement, expiratory wheezing on exam Abd: Soft, nontender, no hepatomegaly  Ext: No edema,  pulses 2+ Musculoskeletal: No deformities, BUE and BLE strength normal and equal Skin: Warm and dry, no rashes   Neuro: Alert and oriented to person, place, time, and situation, CNII-XII grossly intact, no focal deficits  Psych: Normal mood and affect   ASSESSMENT:   LEISEL PINETTE is a 80 y.o. female who presents for the following: 1. SOB (shortness of breath)   2. HOCM (hypertrophic obstructive cardiomyopathy) (HCC)   3. Paroxysmal atrial fibrillation (HCC)     PLAN:   1. SOB (shortness of breath) -EKG shows she is maintaining sinus rhythm. -No evidence of volume overload. -She has a history of asthma and I have reviewed her records from [place].  She was seen pulmonary and there are concerns for progression of COPD. -She is noticeably wheezing on examination.  I suspect there may have been potential allergens here in [place] when she arrived.  This does not appear to be congestive heart failure. -She likely is a bit dehydrated which will make her outflow tract obstruction worse.  I recommended she increase fluids. -I did discuss her case with pulmonary.  We will pursue a prednisone taper.  She will pursue 40 mg  for 2 days, 30 mg for 2 days, 20 mg for 2 days, 10 mg for 2 days and stop.  She will increase her Symbicort to 3 times daily for the next 5 days. -Will check a BMP and BNP to exclude any heart failure or kidney issues here.  I would also like for her to get a chest x-ray to exclude pneumonia.  I do not feel she has pneumonia but we need to make sure. -I think this will get her through the weekend.  She is planning to head back to [facility] on Sunday.  She should then see her pulmonologist for further evaluation.  2. HOCM (hypertrophic obstructive cardiomyopathy) (HCC) -History of sigmoid variant hypertrophic cardiomyopathy.  She has a noticeable murmur on examination consistent with obstruction.  I suspect this is related to taking her diuretic.  I have asked her to back off on this and take it as needed.  Her symptoms to me are more consistent with asthma. -She has no late gadolinium enhancement on MRI.  She is low risk for sudden cardiac death.  This is further reiterated by her age. -Her heart rate is in the 60s.  I suspect her obstruction not here on examination is related to dehydration.  This should improve with fluids.  3. Paroxysmal atrial fibrillation (HCC) -She is on Eliquis.  Her ITP has resolved. -Maintaining sinus rhythm on amiodarone.  I do not believe her amiodarone is the culprit of her current condition.  This is more likely related to an asthma exacerbation.  See treatment as above.  Disposition: Return if symptoms worsen or fail to improve.  Medication Adjustments/Labs and Tests Ordered: Current medicines are reviewed at length with the patient today.  Concerns regarding medicines are outlined above.  Orders Placed This Encounter  Procedures  . DG Chest 2 View  . Brain natriuretic peptide  . Basic metabolic panel  . EKG 12-Lead   Meds ordered this encounter  Medications  . predniSONE (STERAPRED UNI-PAK 21 TAB) 10 MG (21) TBPK tablet    Sig: Take 4 tablets (40 mg) for 2  days, then 3 tablets (30 mg) for 2 days, then 2 tablets (20 mg ) for 2 days, then 1 tablet (10 mg) for 2 days, then stop    Dispense:  21 tablet  Refill:  0    Patient Instructions  Medication Instructions:  Start Prednisone take 40 mg for 2 days, 30 mg for 2 days, 20 mg for 2 days, 10 mg for 2 days and stop Take Symbicort 3 times a day for 5 days, then reduce back to twice daily.  *If you need a refill on your cardiac medications before your next appointment, please call your pharmacy*   Lab Work: BNP, BMET   If you have labs (blood work) drawn today and your tests are completely normal, you will receive your results only by: Marland Kitchen. MyChart Message (if you have MyChart) OR . A paper copy in the mail If you have any lab test that is abnormal or we need to change your treatment, we will call you to review the results.   Testing/Procedures:  Chest xray - Your physician has requested that you have a chest xray, is a fast and painless imaging test that uses certain electromagnetic waves to create pictures of the structures in and around your chest. This test can help diagnose and monitor conditions such as pneumonia and other lung issues his [facility] [address], [place]. If you should need to call them their phone number is [phone].     Follow-Up: At [facility], you and your health needs are our priority.  As part of our continuing mission to provide you with exceptional heart care, we have created designated Provider Care Teams.  These Care Teams include your primary Cardiologist (physician) and Advanced Practice Providers (APPs -  Physician Assistants and Nurse Practitioners) who all work together to provide you with the care you need, when you need it.  We recommend signing up for the patient portal called "MyChart".  Sign up information is provided on this After Visit Summary.  MyChart is used to connect with patients for Virtual Visits  (Telemedicine).  Patients are able to view lab/test results, encounter notes, upcoming appointments, etc.  Non-urgent messages can be sent to your provider as well.   To learn more about what you can do with MyChart, go to [email or web address].    Your next appointment:   As needed  The format for your next appointment:   In Person  Provider:   Lennie OdorWesley O'Neal, MD   Other Instructions See Pulmonology      Time Spent with Patient: I have spent a total of 35 minutes with patient reviewing hospital notes, telemetry, EKGs, labs and examining the patient as well as establishing an assessment and plan that was discussed with the patient.  > 50% of time was spent in direct patient care.  Signed, Lenna GilfordWesley T. Flora Lipps'Neal, MD, [facility] [place]  [facility]  [address] [place], [place] [address] [phone]  11/26/2020 4:57 PM

## 2020-11-26 ENCOUNTER — Ambulatory Visit (INDEPENDENT_AMBULATORY_CARE_PROVIDER_SITE_OTHER): Payer: Medicare Other | Admitting: Cardiovascular Disease

## 2020-11-26 ENCOUNTER — Encounter: Payer: Self-pay | Admitting: Cardiovascular Disease

## 2020-11-26 ENCOUNTER — Other Ambulatory Visit: Payer: Self-pay

## 2020-11-26 VITALS — BP 108/60 | HR 62 | Ht <= 58 in | Wt 93.9 lb

## 2020-11-26 DIAGNOSIS — I480 Paroxysmal atrial fibrillation: Secondary | ICD-10-CM | POA: Diagnosis not present

## 2020-11-26 DIAGNOSIS — R0602 Shortness of breath: Secondary | ICD-10-CM

## 2020-11-26 DIAGNOSIS — I421 Obstructive hypertrophic cardiomyopathy: Secondary | ICD-10-CM | POA: Diagnosis not present

## 2020-11-26 MED ORDER — PREDNISONE 10 MG (21) PO TBPK: ORAL_TABLET | ORAL | 0 refills | Status: AC

## 2020-11-26 NOTE — Patient Instructions (Addendum)
Medication Instructions:  Start Prednisone take 40 mg for 2 days, 30 mg for 2 days, 20 mg for 2 days, 10 mg for 2 days and stop Take Symbicort 3 times a day for 5 days, then reduce back to twice daily.  *If you need a refill on your cardiac medications before your next appointment, please call your pharmacy*   Lab Work: BNP, BMET   If you have labs (blood work) drawn today and your tests are completely normal, you will receive your results only by: Marland Kitchen MyChart Message (if you have MyChart) OR . A paper copy in the mail If you have any lab test that is abnormal or we need to change your treatment, we will call you to review the results.   Testing/Procedures:  Chest xray - Your physician has requested that you have a chest xray, is a fast and painless imaging test that uses certain electromagnetic waves to create pictures of the structures in and around your chest. This test can help diagnose and monitor conditions such as pneumonia and other lung issues his will be done at [facility]  [facility] [address], [place]. If you should need to call them their phone number is [phone].     Follow-Up: At [facility], you and your health needs are our priority.  As part of our continuing mission to provide you with exceptional heart care, we have created designated Provider Care Teams.  These Care Teams include your primary Cardiologist (physician) and Advanced Practice Providers (APPs -  Physician Assistants and Nurse Practitioners) who all work together to provide you with the care you need, when you need it.  We recommend signing up for the patient portal called "MyChart".  Sign up information is provided on this After Visit Summary.  MyChart is used to connect with patients for Virtual Visits (Telemedicine).  Patients are able to view lab/test results, encounter notes, upcoming appointments, etc.  Non-urgent messages can be sent to your provider as well.   To learn more about what you  can do with MyChart, go to [email or web address].    Your next appointment:   As needed  The format for your next appointment:   In Person  Provider:   Lennie Odor, MD   Other Instructions See Pulmonology

## 2020-11-27 LAB — BASIC METABOLIC PANEL
BUN/Creatinine Ratio: 18 (ref 12–28)
BUN: 16 mg/dL (ref 8–27)
CO2: 26 mmol/L (ref 20–29)
Calcium: 10.1 mg/dL (ref 8.7–10.3)
Chloride: 89 mmol/L — ABNORMAL LOW (ref 96–106)
Creatinine, Ser: 0.88 mg/dL (ref 0.57–1.00)
Glucose: 105 mg/dL — ABNORMAL HIGH (ref 65–99)
Potassium: 3.8 mmol/L (ref 3.5–5.2)
Sodium: 131 mmol/L — ABNORMAL LOW (ref 134–144)
eGFR: 68 mL/min/{1.73_m2} (ref >59)

## 2020-11-27 LAB — BRAIN NATRIURETIC PEPTIDE: BNP: 472.3 pg/mL — ABNORMAL HIGH (ref 0.0–100.0)

## 2021-08-26 DEATH — deceased
